# Patient Record
Sex: Male | Born: 2000 | Race: White | Hispanic: No | Marital: Single | Smoking: Never smoker
Health system: Southern US, Community
[De-identification: ages and names within clinical notes are randomized; demographics above are authoritative.]

## PROBLEM LIST (undated history)

## (undated) DIAGNOSIS — G43909 Migraine, unspecified, not intractable, without status migrainosus: Secondary | ICD-10-CM

## (undated) HISTORY — DX: Migraine, unspecified, not intractable, without status migrainosus: G43.909

## (undated) HISTORY — PX: WISDOM TOOTH EXTRACTION: SHX21

## (undated) HISTORY — PX: ULNAR COLLATERAL LIGAMENT RECONSTRUCTION: SHX2593

---
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
## 2018-08-14 DIAGNOSIS — M25529 Pain in unspecified elbow: Secondary | ICD-10-CM | POA: Insufficient documentation

## 2018-12-06 ENCOUNTER — Ambulatory Visit (INDEPENDENT_AMBULATORY_CARE_PROVIDER_SITE_OTHER): Payer: 59 | Admitting: Family Medicine

## 2018-12-06 DIAGNOSIS — H60502 Unspecified acute noninfective otitis externa, left ear: Secondary | ICD-10-CM

## 2018-12-06 MED ORDER — ACETIC ACID 2 % OT SOLN: 4.0000 [drp] | Freq: Four times a day (QID) | OTIC | 0 refills | Status: DC

## 2018-12-06 NOTE — Progress Notes (Signed)
   Chief Complaint:  Jacob Hebert is a 18 y.o. male who presents today for a virtual office visit with a chief complaint of ear drainage.   Assessment/Plan:  Otitis Externa Discussed limitations of virtual visit and inability to perform physical exam. History consistent with otitis externa, though cannot rule out ruptured otitis media (less likely due to normal hearing). Will start acetic acid drops. Will need in person office visit if not improving. Discussed reasons to return to care. Follow up as needed.     Subjective:  HPI:  Ear Pain  Started yesterday morning. Associated with drainage. Drainage is bloody. Hearing is normal. Nothing like this before. No obvious precipitating events. No recent swimming. No treatments tried. No other obvious aggravating or alleviating factors.   ROS: Per HPI  PMH: He reports that he has been smoking e-cigarettes. He has never used smokeless tobacco. He reports current alcohol use of about 7.0 standard drinks of alcohol per week. He reports that he does not use drugs.      Objective/Observations  Physical Exam: Gen: NAD, resting comfortably Pulm: Normal work of breathing Neuro: Grossly normal, moves all extremities Psych: Normal affect and thought content  Virtual Visit via Video   I connected with Jacob Hebert on 12/06/18 at 11:20 AM EST by a video enabled telemedicine application and verified that I am speaking with the correct person using two identifiers. I discussed the limitations of evaluation and management by telemedicine and the availability of in person appointments. The patient expressed understanding and agreed to proceed.   Patient location: Home Provider location: [place] participating in the virtual visit: Myself and Patient     Algis Greenhouse. Jerline Pain, MD 12/06/2018 11:25 AM

## 2018-12-19 ENCOUNTER — Ambulatory Visit (INDEPENDENT_AMBULATORY_CARE_PROVIDER_SITE_OTHER): Payer: 59 | Admitting: Family Medicine

## 2018-12-19 ENCOUNTER — Encounter: Payer: Self-pay | Admitting: Family Medicine

## 2018-12-19 ENCOUNTER — Other Ambulatory Visit: Payer: Self-pay

## 2018-12-19 ENCOUNTER — Other Ambulatory Visit (HOSPITAL_COMMUNITY)
Admission: RE | Admit: 2018-12-19 | Discharge: 2018-12-19 | Disposition: A | Discharge status: Home / Self Care | Payer: 59 | Source: Ambulatory Visit | Attending: Family Medicine | Admitting: Family Medicine

## 2018-12-19 VITALS — BP 110/70 | HR 70 | Temp 98.1°F | Ht 75.0 in | Wt 207.4 lb

## 2018-12-19 DIAGNOSIS — Z114 Encounter for screening for human immunodeficiency virus [HIV]: Secondary | ICD-10-CM

## 2018-12-19 DIAGNOSIS — Z113 Encounter for screening for infections with a predominantly sexual mode of transmission: Secondary | ICD-10-CM | POA: Insufficient documentation

## 2018-12-19 DIAGNOSIS — Z23 Encounter for immunization: Secondary | ICD-10-CM | POA: Diagnosis not present

## 2018-12-19 DIAGNOSIS — R1030 Lower abdominal pain, unspecified: Secondary | ICD-10-CM | POA: Diagnosis not present

## 2018-12-19 DIAGNOSIS — Z118 Encounter for screening for other infectious and parasitic diseases: Secondary | ICD-10-CM

## 2018-12-19 LAB — COMPREHENSIVE METABOLIC PANEL
ALT: 10 U/L (ref 0–53)
AST: 12 U/L (ref 0–37)
Albumin: 4.8 g/dL (ref 3.5–5.2)
Alkaline Phosphatase: 90 U/L (ref 39–117)
BUN: 14 mg/dL (ref 6–23)
CO2: 28 mEq/L (ref 19–32)
Calcium: 9.2 mg/dL (ref 8.4–10.5)
Chloride: 102 mEq/L (ref 96–112)
Creatinine, Ser: 0.94 mg/dL (ref 0.40–1.50)
GFR: 101.62 mL/min (ref >60.00)
Glucose, Bld: 87 mg/dL (ref 70–99)
Potassium: 4 mEq/L (ref 3.5–5.1)
Sodium: 139 mEq/L (ref 135–145)
Total Bilirubin: 0.9 mg/dL (ref 0.2–1.2)
Total Protein: 6.9 g/dL (ref 6.0–8.3)

## 2018-12-19 LAB — CBC WITH DIFFERENTIAL/PLATELET
Basophils Absolute: 0 10*3/uL (ref 0.0–0.1)
Basophils Relative: 0.3 % (ref 0.0–3.0)
Eosinophils Absolute: 0 10*3/uL (ref 0.0–0.7)
Eosinophils Relative: 1 % (ref 0.0–5.0)
HCT: 43.5 % (ref 39.0–52.0)
Hemoglobin: 15.6 g/dL (ref 13.0–17.0)
Lymphocytes Relative: 27.6 % (ref 12.0–46.0)
Lymphs Abs: 1.2 10*3/uL (ref 0.7–4.0)
MCHC: 35.9 g/dL (ref 30.0–36.0)
MCV: 82.9 fl (ref 78.0–100.0)
Monocytes Absolute: 0.4 10*3/uL (ref 0.1–1.0)
Monocytes Relative: 9.8 % (ref 3.0–12.0)
Neutro Abs: 2.6 10*3/uL (ref 1.4–7.7)
Neutrophils Relative %: 61.3 % (ref 43.0–77.0)
Platelets: 199 10*3/uL (ref 150.0–400.0)
RBC: 5.25 Mil/uL (ref 4.22–5.81)
RDW: 13.8 % (ref 11.5–14.6)
WBC: 4.3 10*3/uL — ABNORMAL LOW (ref 4.5–10.5)

## 2018-12-19 LAB — C-REACTIVE PROTEIN: CRP: <1 mg/dL (ref 0.5–20.0)

## 2018-12-19 LAB — SEDIMENTATION RATE: Sed Rate: 3 mm/hr (ref 0–15)

## 2018-12-19 NOTE — Patient Instructions (Addendum)
Health Maintenance Due  Topic Date Due  . HIV Screening - today along with other std tests 04/12/2013  . INFLUENZA VACCINE -today 09/02/2018   Please let us know if you have new or worsening symptoms  Lets start workup today with bloodwork, urine and we will contact you about ultrasound of abdomen.   If blood in stool- may get GI referral- I am also doing a test to look for inflammation in colon

## 2018-12-19 NOTE — Progress Notes (Signed)
Phone [phone] In person visit   Subjective:   Jacob Hebert is a 18 y.o. year old very pleasant male patient who presents for/with See problem oriented charting Chief Complaint  Patient presents with  . Follow-up  . stomach issues   ROS-no fever/chills.  Has lower abdominal pain.  Blood on toilet paper when wiping at least once a week.  Past Medical History-  Patient Active Problem List   Diagnosis Date Noted  . Insomnia 07/05/2017    Priority: Medium  . Asthma, mild intermittent     Priority: Medium  . Migraines     Priority: Medium  . Seasonal allergies 07/05/2017    Priority: Low  . Intermittent diarrhea 07/05/2017    Priority: Low  . Scapular dyskinesis 11/18/2016    Priority: Low    Medications- reviewed and updated Current Outpatient Medications  Medication Sig Dispense Refill  . acetic acid 2 % otic solution Place 4 drops into the left ear 4 (four) times daily. 15 mL 0  . albuterol (PROAIR HFA) 108 (90 Base) MCG/ACT inhaler Inhale 2 puffs into the lungs every 6 (six) hours as needed for wheezing or shortness of breath. 18 g 5  . levocetirizine (XYZAL) 5 MG tablet Take 5 mg by mouth every evening.     No current facility-administered medications for this visit.      Objective:  BP 110/70   Pulse 70   Temp 98.1 F (36.7 C)   Ht 6\' 3"  (1.905 m)   Wt 207 lb 6.4 oz (94.1 kg)   SpO2 98%   BMI 25.92 kg/m  Gen: NAD, resting comfortably CV: RRR no murmurs rubs or gallops Lungs: CTAB no crackles, wheeze, rhonchi Abdomen: soft/diffuse mild tenderness at bellybutton level and below/nondistended/normal bowel sounds.  Ext: no edema Skin: warm, dry Neuro: Normal gait and speech    Assessment and Plan   #tobacco- vaping some - encouraged cessation   #Social update-Tommy john surgery 11-12 weeks ago. Taking this semester off- starting back in the spring online at [employer].   Lower abdominal pain -  S:pt states when he wakes up his stomach "hurts" with pain  around the belly button and has to rush to the bathroom most morning. He states depending on what he eats decides how the next hours go, he has to eat a bland diet to keep his stomach from hurting. This has been going on since high school. Pt denies nausea vomiting and has diarrhea sometime in the mornings.He is having regular bowel movements. Abdominal cramping does get better with bowel movements but sometimes has recurrent bowel movement (has to go again quickly after going). Does not feel like symptoms are worse but having gone through shoulder surgery recently and not being in school right now and not being as active just appears morestable since he is less active.Ibuprofen some since surgery-not sure this helps much   After gets something basic in the morning/bland (like a plain biscuit but heavier things do not sit well- sometimes even skips breakfast)- he is able to tolerate other foods during the day. Has had less issues with [facility] or [place] food- used to cause diarrhea.   His main concern is being able to eat breakfast without pain.    He denies urinary symptoms like burning with peeing or frequent urination or penile discharge.  He has had blood in his stool at times- at least once a week but not down in toilet-mainly with wiping.  A/P: 18 year old male largely previously healthy (  other than migraines, insomnia, asthma) with history of GI issues including intermittent diarrhea throughout high school now presenting with pain in his lower abdomen for at least the last year in the morning which improves as the day goes on without intervention but is worsened by heavy meals. -We will get basic labs including CBC, CMP, UA.  Given reported blood in the stool will get fecal occult blood test when not actively having bleeding.  Consider GI referral.  Sedimentation rate and CRP were not elevated pointing away from ulcerative colitis.  We will also get an abdominal ultrasound-we will target right upper  quadrant to rule out gallbladder disease though I do not suspect that is the cause of his symptoms.  #No penile discharge but has had unprotected sex without prior STD follow-up testing-he agrees to these being done today  Recommended follow up: As needed for new or worsening symptoms or failure to improve  Lab/Order associations:   ICD-10-CM   1. Lower abdominal pain  R10.30 CBC with Differential    Comprehensive metabolic panel    POC UA    US Abdomen Complete    Sedimentation rate    C-reactive protein    Fecal occult blood, imunochemical    Fecal occult blood, imunochemical    POC UA    CANCELED: Stool cards as of 07/01/17  2. Need for immunization against influenza  Z23 Flu Vaccine QUAD 36+ mos IM  3. Screening for HIV (human immunodeficiency virus)  Z11.4 HIV antibody  4. Screening examination for venereal disease  Z11.3 RPR  5. Screening for gonorrhea  Z11.3 Urine cytology ancillary only    Urine cytology ancillary only  6. Screening for chlamydial disease  Z11.8 Urine cytology ancillary only    Urine cytology ancillary only    Time Stamp The duration of face-to-face time during this visit was greater than 25 minutes. Greater than 50% of this time was spent in counseling, explanation of diagnosis, planning of further management, and/or coordination of care including discussion of potential causes, discussion of needed work-up, counseling on need for STD screening as well as protection use, counseling about influenza vaccination.    Return precautions advised.  Garret Reddish, MD

## 2018-12-20 LAB — POCT URINALYSIS DIP (MANUAL ENTRY)
Bilirubin, UA: NEGATIVE
Blood, UA: NEGATIVE
Glucose, UA: NEGATIVE mg/dL
Ketones, POC UA: NEGATIVE mg/dL
Leukocytes, UA: NEGATIVE
Nitrite, UA: NEGATIVE
Protein Ur, POC: NEGATIVE mg/dL
Spec Grav, UA: >=1.03 — AB (ref 1.010–1.025)
Urobilinogen, UA: 0.2 E.U./dL
pH, UA: 5.5 (ref 5.0–8.0)

## 2018-12-20 LAB — URINE CYTOLOGY ANCILLARY ONLY
Chlamydia: NEGATIVE
Comment: NEGATIVE
Comment: NEGATIVE
Comment: NORMAL
Neisseria Gonorrhea: NEGATIVE
Trichomonas: NEGATIVE

## 2018-12-20 LAB — HIV ANTIBODY (ROUTINE TESTING W REFLEX): HIV 1&2 Ab, 4th Generation: NONREACTIVE

## 2018-12-20 LAB — RPR: RPR Ser Ql: NONREACTIVE

## 2018-12-20 NOTE — Addendum Note (Signed)
Addended by: Francis Dowse T on: 12/20/2018 09:04 AM   Modules accepted: Orders

## 2018-12-27 ENCOUNTER — Other Ambulatory Visit (INDEPENDENT_AMBULATORY_CARE_PROVIDER_SITE_OTHER): Payer: 59

## 2018-12-27 ENCOUNTER — Other Ambulatory Visit: Payer: Self-pay

## 2018-12-27 DIAGNOSIS — R197 Diarrhea, unspecified: Secondary | ICD-10-CM

## 2018-12-27 LAB — FECAL OCCULT BLOOD, IMMUNOCHEMICAL: Fecal Occult Bld: NEGATIVE

## 2019-06-26 ENCOUNTER — Other Ambulatory Visit: Payer: Self-pay | Admitting: Family Medicine

## 2019-06-28 ENCOUNTER — Telehealth: Payer: Self-pay | Admitting: Family Medicine

## 2019-06-28 NOTE — Telephone Encounter (Signed)
Patient refused to go to the ED  Chief Complaint Headache Reason for Call Symptomatic / Request for Health Information Initial Comment Caller states her son is having upper respiratory pain . He has a cough, congestion, headache, and sore throat. He had a negative covid test today. Translation No  Confirm and document reason for call. If symptomatic, describe symptoms. ---Pt has cough, HA congestion and sorethroat (light cough congestion onset Sun am then the rest came on recently). 99.7 temp on temple. [facility] Has the patient had close contact with a person known or suspected to have the novel coronavirus illness OR traveled / lives in area with major community spread (including international travel) in the last 14 days from the onset of symptoms? * If Asymptomatic, screen for exposure and travel within the last 14 days. ---Yes Does the patient have any new or worsening symptoms? ---Yes Will a triage be completed? ---Yes Related visit to physician within the last 2 weeks? ---No Does the PT have any chronic conditions? (i.e. diabetes, asthma, this includes High risk factors for pregnancy, etc.) ---Yes List chronic conditions. ---asthma Is this a behavioral health or substance abuse call? ---No Guidelines Guideline Title Affirmed Question Affirmed Notes Nurse Date/Time (Eastern Time) Asthma Attack Chest pain Ladona Ridgel, RN, Sunny Schlein 06/27/2019 4:57:28 PM COVID-19 - Diagnosed or Suspected Chest pain or pressure Gaddy, RN, Felicia 06/27/2019 5:01:00 PMPLEASE NOTE: All timestamps contained within this report are represented as [place] Standard Time. CONFIDENTIALTY NOTICE: This fax transmission is intended only for the addressee. It contains information that is legally privileged, confidential or otherwise protected from use or disclosure. If you are not the intended recipient, you are strictly prohibited from reviewing, disclosing, copying using or disseminating  any of this information or taking any action in reliance on or regarding this information. If you have received this fax in error, please notify us immediately by telephone so that we can arrange for its return to [place]. Phone: [phone], Toll-Free: [phone], Fax: [phone] Page: 2 of 2 Call Id: [id number] Disp. Time Lamount Cohen Time) Disposition Final User 06/27/2019 5:00:09 PM Go to ED Now Ladona Ridgel, RN, Sunny Schlein 06/27/2019 5:04:10 PM Go to ED Now Yes Ladona Ridgel, RN, Sunny Schlein Disposition Overriden: Go to ED Now (or PCP triage) Override Reason: Specify reason. (Please document in 'advice recommended' section) Caller Disagree/Comply Disagree Caller Understands Yes PreDisposition Did not know what to do Care Advice Given Per Guideline GO TO ED NOW: GO TO ED NOW (OR PCP TRIAGE): GO TO ED NOW: * Tell the first healthcare worker you meet that you may have COVID-19. Comments User: Hampton Abbot, RN Date/Time Lamount Cohen Time): 06/27/2019 4:57:06 PM no specific exposure - but has been around people User: Hampton Abbot, RN Date/Time (Eastern Time): 06/27/2019 4:58:12 PM HA is level 3-4, sore throat is mild User: Hampton Abbot, RN Date/Time (Eastern Time): 06/27/2019 5:08:37 PM office is closed - they dont want to go anywhere tonight and office is closed so cant transfer them for disagreeing - Referrals GO TO FACILITY UNDECIDED GO TO FACILITY REFUSE

## 2019-06-28 NOTE — Telephone Encounter (Signed)
Called and lm for pt tcb. 

## 2019-10-16 ENCOUNTER — Emergency Department (HOSPITAL_BASED_OUTPATIENT_CLINIC_OR_DEPARTMENT_OTHER): Payer: 59

## 2019-10-16 ENCOUNTER — Other Ambulatory Visit: Payer: Self-pay

## 2019-10-16 ENCOUNTER — Emergency Department (HOSPITAL_BASED_OUTPATIENT_CLINIC_OR_DEPARTMENT_OTHER)
Admission: EM | Admit: 2019-10-16 | Discharge: 2019-10-16 | Disposition: A | Discharge status: Home / Self Care | Payer: 59 | Attending: Emergency Medicine | Admitting: Emergency Medicine

## 2019-10-16 ENCOUNTER — Encounter (HOSPITAL_BASED_OUTPATIENT_CLINIC_OR_DEPARTMENT_OTHER): Payer: Self-pay | Admitting: *Deleted

## 2019-10-16 DIAGNOSIS — Y9364 Activity, baseball: Secondary | ICD-10-CM | POA: Insufficient documentation

## 2019-10-16 DIAGNOSIS — W2103XA Struck by baseball, initial encounter: Secondary | ICD-10-CM | POA: Insufficient documentation

## 2019-10-16 DIAGNOSIS — S022XXA Fracture of nasal bones, initial encounter for closed fracture: Secondary | ICD-10-CM | POA: Insufficient documentation

## 2019-10-16 DIAGNOSIS — Z79899 Other long term (current) drug therapy: Secondary | ICD-10-CM | POA: Insufficient documentation

## 2019-10-16 DIAGNOSIS — J4520 Mild intermittent asthma, uncomplicated: Secondary | ICD-10-CM | POA: Insufficient documentation

## 2019-10-16 DIAGNOSIS — F17290 Nicotine dependence, other tobacco product, uncomplicated: Secondary | ICD-10-CM | POA: Insufficient documentation

## 2019-10-16 DIAGNOSIS — Y998 Other external cause status: Secondary | ICD-10-CM | POA: Diagnosis not present

## 2019-10-16 DIAGNOSIS — Y999 Unspecified external cause status: Secondary | ICD-10-CM | POA: Insufficient documentation

## 2019-10-16 DIAGNOSIS — S0992XA Unspecified injury of nose, initial encounter: Secondary | ICD-10-CM | POA: Diagnosis present

## 2019-10-16 MED ORDER — HYDROCODONE-ACETAMINOPHEN 5-325 MG PO TABS: 1.0000 | ORAL_TABLET | ORAL | 0 refills | Status: DC | PRN

## 2019-10-16 MED ORDER — OXYMETAZOLINE HCL 0.05 % NA SOLN
1.0000 | Freq: Once | NASAL | Status: AC
  Administered 2019-10-16: 1 via NASAL
  Filled 2019-10-16: qty 30

## 2019-10-16 NOTE — Discharge Instructions (Addendum)
Use Afrin as needed. Do not blow your nose. Take Motrin/Tylenol as needed as directed. Take Norco in place of your next Tylenol dose for pain not controlled with Tylenol. Apply ice for 20 minutes at a time. Follow up with your ENT. Return to ER for worsening or concerning symptoms.

## 2019-10-16 NOTE — ED Provider Notes (Signed)
[facility] EMERGENCY DEPARTMENT Provider Note   CSN: [id number] Arrival date & time: 10/16/19  1501     History Chief Complaint  Patient presents with  . Facial Injury    Jacob Hebert is a 19 y.o. male.  Jacob Hebert is a 19 year old male with PMH significant for asthma and migraines who presents with a chief complaint of being hit in the face with a baseball today. Patient's mother is present and assists in providing the history. Patient was playing baseball this afternoon when he was struck by a baseball on the left side of his face. He states that he heard some ringing in his ears and fell to the ground and hit his head. He denies LOC, headache, nausea, or vomiting. After the initial shock of the impact patient was able to stand up and the ringing resolved and his nose started bleeding. Prior to arrival to the ED, the epistaxis had resolved and his pain was worst in his teeth/gums. While waiting in the ED the epistaxis resumed but the patient reported blowing his nose frequently since the accident. He is now expereincing pain around his nose and left maxillary area, denies loose teeth or malalignment. He has no current epistaxis and has not taken anything for pain. Patient now reports that he feels full/congested in his nasal passages and ears.        Past Medical History:  Diagnosis Date  . Asthma    sparing albuterol- mainly with sickness. has not had issues with sports in years.   . Migraines    rx through Dr. Chestine Spore. about once a month    Patient Active Problem List   Diagnosis Date Noted  . Insomnia 07/05/2017  . Seasonal allergies 07/05/2017  . Intermittent diarrhea 07/05/2017  . Asthma, mild intermittent   . Migraines   . Scapular dyskinesis 11/18/2016    Past Surgical History:  Procedure Laterality Date  . ULNAR COLLATERAL LIGAMENT RECONSTRUCTION     Tommy John surgery  . WISDOM TOOTH EXTRACTION         Family History  Problem Relation Age  of Onset  . Arthritis Mother   . Atrial fibrillation Mother   . Arthritis Father   . Hyperlipidemia Father   . GER disease Father   . Colon polyps Father        age  6 it appears  . Asthma Brother        age 40 in 2019  . Arthritis Maternal Grandmother   . Diabetes Maternal Grandmother   . Dementia Maternal Grandmother   . Hearing loss Maternal Grandfather     Social History   Tobacco Use  . Smoking status: Current Some Day Smoker    Types: E-cigarettes  . Smokeless tobacco: Never Used  . Tobacco comment: vapes some- advised against  Vaping Use  . Vaping Use: Every day  Substance Use Topics  . Alcohol use: Yes    Alcohol/week: 7.0 standard drinks    Types: 7 Standard drinks or equivalent per week    Comment: never drinks and drives  . Drug use: Never    Home Medications Prior to Admission medications   Medication Sig Start Date End Date Taking? Authorizing Provider  acetic acid 2 % otic solution Place 4 drops into the left ear 4 (four) times daily. 12/06/18   Ardith Dark, MD  HYDROcodone-acetaminophen (NORCO/VICODIN) 5-325 MG tablet Take 1 tablet by mouth every 4 (four) hours as needed. 10/16/19   Eulah Pont,  Gerome Apley, PA-C  levocetirizine (XYZAL) 5 MG tablet Take 5 mg by mouth every evening.    [provider]  VENTOLIN HFA 108 (90 Base) MCG/ACT inhaler INHALE 2 PUFFS INTO THE LUNGS EVERY 6 HOURS AS NEEDED FOR WHEEZING OR SHORTNESS OF BREATH 06/26/19   Shelva Majestic, MD    Allergies    Patient has no known allergies.  Review of Systems   Review of Systems  Constitutional: Negative for fever.  HENT: Positive for congestion, facial swelling and nosebleeds. Negative for dental problem, ear discharge and ear pain.   Eyes: Negative for pain and visual disturbance.  Musculoskeletal: Negative for neck pain and neck stiffness.  Skin: Negative for rash and wound.  Allergic/Immunologic: Negative for immunocompromised state.  Neurological: Negative for dizziness  and headaches.  All other systems reviewed and are negative.   Physical Exam Updated Vital Signs BP (!) 124/45 (BP Location: Right Arm)   Pulse (!) 46   Temp 98.2 F (36.8 C) (Oral)   Resp 16   Ht 6\' 3"  (1.905 m)   Wt 88.5 kg   SpO2 98%   BMI 24.37 kg/m   Physical Exam Vitals and nursing note reviewed.  Constitutional:      General: He is not in acute distress.    Appearance: He is well-developed. He is not diaphoretic.  HENT:     Head: Normocephalic and atraumatic.     Right Ear: Tympanic membrane and ear canal normal.     Left Ear: Tympanic membrane and ear canal normal.     Nose: Septal deviation, signs of injury and nasal tenderness present.     Right Nostril: Epistaxis present. No septal hematoma.     Left Nostril: No septal hematoma.      Comments: No orbital tenderness, EOMI. No active bleeding, blood noted in both nostrils, more so on the right.  Pulmonary:     Effort: Pulmonary effort is normal.  Skin:    General: Skin is warm and dry.     Findings: Bruising present. No erythema or rash.  Neurological:     Mental Status: He is alert and oriented to person, place, and time.  Psychiatric:        Behavior: Behavior normal.     ED Results / Procedures / Treatments   Labs (all labs ordered are listed, but only abnormal results are displayed) Labs Reviewed - No data to display  EKG None  Radiology DG Nasal Bones  Result Date: 10/16/2019 CLINICAL DATA:  Hit by baseball EXAM: NASAL BONES - 3+ VIEW COMPARISON:  None. FINDINGS: There are apparent fractures of the left and right nasal bones as well as a fracture of the anterior nasal spine. Alignment in these areas near anatomic. No dislocation. There is rightward deviation of the lower nasal septum. Visualized paranasal sinuses clear. IMPRESSION: Fractures of the left and right nasal bones as well as the anterior nasal spine with alignment in these areas near anatomic. No dislocation. Visualized paranasal sinuses  clear. Electronically Signed   By: 10/18/2019 III M.D.   On: 10/16/2019 16:17    Procedures Procedures (including critical care time)  Medications Ordered in ED Medications  oxymetazoline (AFRIN) 0.05 % nasal spray 1 spray (1 spray Each Nare Given 10/16/19 1521)    ED Course  I have reviewed the triage vital signs and the nursing notes.  Pertinent labs & imaging results that were available during my care of the patient were reviewed by me and considered  in my medical decision making (see chart for details).  Clinical Course as of Oct 16 2002  Tue Oct 16, 2019  [id number] 19 year old male with nose injury from baseball to the face today. No loose teeth noted however with report of soreness, recommend soft diet and see dentist. XR + for fractures, no active bleeding at time of exam. Advised to use afrin as needed, do NOT blow nose, apply ice for 20 minutes at a time and follow up with ENT. Given rx for Norco for pain not controlled with OTC meds.    [LM]    Clinical Course User Index [LM] Alden Hipp   MDM Rules/Calculators/A&P                         Final Clinical Impression(s) / ED Diagnoses Final diagnoses:  Closed fracture of nasal bone, initial encounter    Rx / DC Orders ED Discharge Orders         Ordered    HYDROcodone-acetaminophen (NORCO/VICODIN) 5-325 MG tablet  Every 4 hours PRN        10/16/19 1938           Jeannie Fend, PA-C 10/16/19 2004    Arby Barrette, MD 10/17/19 2104

## 2019-10-16 NOTE — ED Triage Notes (Signed)
Nose injury by baseball x 30 mins ago

## 2019-10-23 DIAGNOSIS — S022XXA Fracture of nasal bones, initial encounter for closed fracture: Secondary | ICD-10-CM | POA: Insufficient documentation

## 2019-10-24 ENCOUNTER — Other Ambulatory Visit: Payer: Self-pay

## 2019-10-24 ENCOUNTER — Encounter (HOSPITAL_BASED_OUTPATIENT_CLINIC_OR_DEPARTMENT_OTHER): Payer: Self-pay | Admitting: Otolaryngology

## 2019-10-24 ENCOUNTER — Other Ambulatory Visit (HOSPITAL_COMMUNITY)
Admission: RE | Admit: 2019-10-24 | Discharge: 2019-10-24 | Disposition: A | Discharge status: Home / Self Care | Payer: 59 | Source: Ambulatory Visit | Attending: Otolaryngology | Admitting: Otolaryngology

## 2019-10-24 DIAGNOSIS — Z01812 Encounter for preprocedural laboratory examination: Secondary | ICD-10-CM | POA: Diagnosis not present

## 2019-10-24 DIAGNOSIS — Z20822 Contact with and (suspected) exposure to covid-19: Secondary | ICD-10-CM | POA: Diagnosis not present

## 2019-10-24 LAB — SARS CORONAVIRUS 2 (TAT 6-24 HRS): SARS Coronavirus 2: NEGATIVE

## 2019-10-24 NOTE — H&P (Signed)
HPI:   Jacob Hebert is a 19 y.o. male who presents as a consult Patient.   Referring Provider: Domenic Schwab I*  Chief complaint: Nasal fracture.  HPI: 1 week ago today he was hit in the nose by a baseball at practice. He had bruising swelling and bleeding. He is having difficulty breathing through the left. He feels that there is a cosmetic deformity of the left side. No prior history of nasal fracture.  PMH/Meds/All/SocHx/FamHx/ROS:   Past Medical History:  Diagnosis Date  . Asthma   Past Surgical History:  Procedure Laterality Date  . ELBOW SURGERY   No family history of bleeding disorders, wound healing problems or difficulty with anesthesia.   Social History   Socioeconomic History  . Marital status: Single  Spouse name: Not on file  . Number of children: Not on file  . Years of education: Not on file  . Highest education level: Not on file  Occupational History  . Not on file  Tobacco Use  . Smoking status: Current Every Day Smoker  Types: E-Cig/Vapor w/Nicotine  . Smokeless tobacco: Never Used  Vaping Use  . Vaping Use: Every day  Substance and Sexual Activity  . Alcohol use: Not on file  . Drug use: Never  . Sexual activity: Not on file  Other Topics Concern  . Not on file  Social History Narrative  . Not on file   Social Determinants of Health   Financial Resource Strain:  . Difficulty of Paying Living Expenses: Not on file  Food Insecurity:  . Worried About Programme researcher, broadcasting/film/video in the Last Year: Not on file  . Ran Out of Food in the Last Year: Not on file  Transportation Needs:  . Lack of Transportation (Medical): Not on file  . Lack of Transportation (Non-Medical): Not on file  Physical Activity:  . Days of Exercise per Week: Not on file  . Minutes of Exercise per Session: Not on file  Stress:  . Feeling of Stress : Not on file  Social Connections:  . Frequency of Communication with Friends and Family: Not on file  . Frequency of  Social Gatherings with Friends and Family: Not on file  . Attends Religious Services: Not on file  . Active Member of Clubs or Organizations: Not on file  . Attends Banker Meetings: Not on file  . Marital Status: Not on file   Current Outpatient Medications:  . albuterol 90 mcg/actuation inhaler, albuterol sulfate HFA 90 mcg/actuation aerosol inhaler, Disp: , Rfl:  . HYDROcodone-acetaminophen (NORCO) 5-325 mg per tablet, TAKE 1 TABLET BY MOUTH EVERY 4 (FOUR) HOURS AS NEEDED., Disp: , Rfl:   A complete ROS was performed with pertinent positives/negatives noted in the HPI. The remainder of the ROS are negative.   Physical Exam:   BP 112/66  Pulse 51  Temp 97.7 F (36.5 C)  Ht 1.905 m (6\' 3" )  Wt 90.3 kg (199 lb)  BMI 24.87 kg/m   General: Healthy and alert, in no distress, breathing easily. Normal affect. In a pleasant mood. Head: Normocephalic, atraumatic. No masses, or scars. Eyes: Bilateral eyelid ecchymosis. Pupils are equal, and reactive to light. Vision is grossly intact. No spontaneous or gaze nystagmus. Ears: Ear canals are clear. Tympanic membranes are intact, with normal landmarks and the middle ears are clear and healthy. Hearing: Grossly normal. Nose: There is palpable depression of the left nasal bone. There is some residual bruising and swelling. There is leftward septal deviation  with significant nasal obstruction on the left. There is no evidence of septal hematoma. Face: No masses or scars, facial nerve function is symmetric. Oral Cavity: No mucosal abnormalities are noted. Tongue with normal mobility. Dentition appears healthy. Oropharynx: Tonsils are symmetric. There are no mucosal masses identified. Tongue base appears normal and healthy. Larynx/Hypopharynx: deferred Chest: Deferred Neck: No palpable masses, no cervical adenopathy, no thyroid nodules or enlargement. Neuro: Cranial nerves II-XII with normal function. Balance: Normal gate. Other  findings: none.  Independent Review of Additional Tests or Records:  none  Procedures:  none  Impression & Plans:  Nasal fracture with displacement. Recommend closed reduction under anesthesia. This will need to be performed in the next week or 2. He would like to get this done as soon as possible. He will need a face protector for returning to baseball after the procedure.

## 2019-10-26 ENCOUNTER — Ambulatory Visit (HOSPITAL_BASED_OUTPATIENT_CLINIC_OR_DEPARTMENT_OTHER)
Admission: RE | Admit: 2019-10-26 | Discharge: 2019-10-26 | Disposition: A | Discharge status: Home / Self Care | Payer: 59 | Attending: Otolaryngology | Admitting: Otolaryngology

## 2019-10-26 ENCOUNTER — Encounter (HOSPITAL_BASED_OUTPATIENT_CLINIC_OR_DEPARTMENT_OTHER): Payer: Self-pay | Admitting: Otolaryngology

## 2019-10-26 ENCOUNTER — Ambulatory Visit (HOSPITAL_BASED_OUTPATIENT_CLINIC_OR_DEPARTMENT_OTHER): Payer: 59 | Admitting: Certified Registered"

## 2019-10-26 ENCOUNTER — Other Ambulatory Visit: Payer: Self-pay

## 2019-10-26 ENCOUNTER — Encounter (HOSPITAL_BASED_OUTPATIENT_CLINIC_OR_DEPARTMENT_OTHER)
Admission: RE | Disposition: A | Discharge status: Home / Self Care | Payer: Self-pay | Source: Home / Self Care | Attending: Otolaryngology

## 2019-10-26 DIAGNOSIS — Z79899 Other long term (current) drug therapy: Secondary | ICD-10-CM | POA: Diagnosis not present

## 2019-10-26 DIAGNOSIS — W2103XA Struck by baseball, initial encounter: Secondary | ICD-10-CM | POA: Diagnosis not present

## 2019-10-26 DIAGNOSIS — J45909 Unspecified asthma, uncomplicated: Secondary | ICD-10-CM | POA: Insufficient documentation

## 2019-10-26 DIAGNOSIS — F17290 Nicotine dependence, other tobacco product, uncomplicated: Secondary | ICD-10-CM | POA: Diagnosis not present

## 2019-10-26 DIAGNOSIS — S022XXA Fracture of nasal bones, initial encounter for closed fracture: Secondary | ICD-10-CM | POA: Insufficient documentation

## 2019-10-26 HISTORY — PX: CLOSED REDUCTION NASAL FRACTURE: SHX5365

## 2019-10-26 SURGERY — CLOSED REDUCTION, FRACTURE, NASAL BONE
Anesthesia: General | Site: Nose

## 2019-10-26 MED ORDER — BACITRACIN ZINC 500 UNIT/GM EX OINT
TOPICAL_OINTMENT | CUTANEOUS | Status: AC
  Filled 2019-10-26: qty 28.35

## 2019-10-26 MED ORDER — OXYMETAZOLINE HCL 0.05 % NA SOLN
2.0000 | NASAL | Status: DC
  Administered 2019-10-26: 2 via NASAL

## 2019-10-26 MED ORDER — LACTATED RINGERS IV SOLN: INTRAVENOUS | Status: DC

## 2019-10-26 MED ORDER — LIDOCAINE 2% (20 MG/ML) 5 ML SYRINGE
INTRAMUSCULAR | Status: AC
  Filled 2019-10-26: qty 5

## 2019-10-26 MED ORDER — ONDANSETRON HCL 4 MG/2ML IJ SOLN
INTRAMUSCULAR | Status: AC
  Filled 2019-10-26: qty 2

## 2019-10-26 MED ORDER — LIDOCAINE-EPINEPHRINE 1 %-1:100000 IJ SOLN
INTRAMUSCULAR | Status: AC
  Filled 2019-10-26: qty 1

## 2019-10-26 MED ORDER — MEPERIDINE HCL 25 MG/ML IJ SOLN: 6.2500 mg | INTRAMUSCULAR | Status: DC | PRN

## 2019-10-26 MED ORDER — FENTANYL CITRATE (PF) 100 MCG/2ML IJ SOLN
INTRAMUSCULAR | Status: DC | PRN
Start: 2019-10-26 — End: 2019-10-26
  Administered 2019-10-26 (×2): 50 ug via INTRAVENOUS

## 2019-10-26 MED ORDER — DEXAMETHASONE SODIUM PHOSPHATE 10 MG/ML IJ SOLN
INTRAMUSCULAR | Status: DC | PRN
  Administered 2019-10-26: 10 mg via INTRAVENOUS

## 2019-10-26 MED ORDER — OXYMETAZOLINE HCL 0.05 % NA SOLN
NASAL | Status: AC
  Filled 2019-10-26: qty 30

## 2019-10-26 MED ORDER — FENTANYL CITRATE (PF) 100 MCG/2ML IJ SOLN
INTRAMUSCULAR | Status: AC
  Filled 2019-10-26: qty 2

## 2019-10-26 MED ORDER — OXYMETAZOLINE HCL 0.05 % NA SOLN
NASAL | Status: DC | PRN
  Administered 2019-10-26: 1

## 2019-10-26 MED ORDER — MIDAZOLAM HCL 5 MG/5ML IJ SOLN
INTRAMUSCULAR | Status: DC | PRN
  Administered 2019-10-26: 2 mg via INTRAVENOUS

## 2019-10-26 MED ORDER — ONDANSETRON HCL 4 MG/2ML IJ SOLN: 4.0000 mg | Freq: Once | INTRAMUSCULAR | Status: DC | PRN

## 2019-10-26 MED ORDER — PROPOFOL 10 MG/ML IV BOLUS
INTRAVENOUS | Status: AC
  Filled 2019-10-26: qty 40

## 2019-10-26 MED ORDER — PROPOFOL 10 MG/ML IV BOLUS
INTRAVENOUS | Status: DC | PRN
  Administered 2019-10-26: 200 mg via INTRAVENOUS
  Administered 2019-10-26: 100 mg via INTRAVENOUS

## 2019-10-26 MED ORDER — ONDANSETRON HCL 4 MG/2ML IJ SOLN
INTRAMUSCULAR | Status: DC | PRN
  Administered 2019-10-26: 4 mg via INTRAVENOUS

## 2019-10-26 MED ORDER — DEXAMETHASONE SODIUM PHOSPHATE 10 MG/ML IJ SOLN
INTRAMUSCULAR | Status: AC
  Filled 2019-10-26: qty 1

## 2019-10-26 MED ORDER — HYDROMORPHONE HCL 1 MG/ML IJ SOLN: 0.2500 mg | INTRAMUSCULAR | Status: DC | PRN

## 2019-10-26 MED ORDER — PROPOFOL 10 MG/ML IV BOLUS
INTRAVENOUS | Status: AC
  Filled 2019-10-26: qty 20

## 2019-10-26 MED ORDER — MIDAZOLAM HCL 2 MG/2ML IJ SOLN
INTRAMUSCULAR | Status: AC
  Filled 2019-10-26: qty 2

## 2019-10-26 MED ORDER — LIDOCAINE-EPINEPHRINE 1 %-1:100000 IJ SOLN
INTRAMUSCULAR | Status: DC | PRN
  Administered 2019-10-26: 1 mL

## 2019-10-26 MED ORDER — LIDOCAINE HCL (CARDIAC) PF 100 MG/5ML IV SOSY
PREFILLED_SYRINGE | INTRAVENOUS | Status: DC | PRN
  Administered 2019-10-26: 100 mg via INTRAVENOUS

## 2019-10-26 SURGICAL SUPPLY — 37 items
APL SKNCLS STERI-STRIP NONHPOA (GAUZE/BANDAGES/DRESSINGS) ×1
BENZOIN TINCTURE PRP APPL 2/3 (GAUZE/BANDAGES/DRESSINGS) ×3 IMPLANT
CANISTER SUCT 1200ML W/VALVE (MISCELLANEOUS) ×3 IMPLANT
CLOSURE WOUND 1/4X4 (GAUZE/BANDAGES/DRESSINGS) ×1
CNTNR URN SCR LID CUP LEK RST (MISCELLANEOUS) IMPLANT
CONT SPEC 4OZ STRL OR WHT (MISCELLANEOUS)
COVER BACK TABLE 60X90IN (DRAPES) ×3 IMPLANT
DECANTER SPIKE VIAL GLASS SM (MISCELLANEOUS) ×2 IMPLANT
DRSG CURAD 3X16 NADH (PACKING) IMPLANT
DRSG NASAL KENNEDY LMNT 8CM (GAUZE/BANDAGES/DRESSINGS) IMPLANT
DRSG NASOPORE 8CM (GAUZE/BANDAGES/DRESSINGS) IMPLANT
DRSG TELFA 3X8 NADH (GAUZE/BANDAGES/DRESSINGS) IMPLANT
GAUZE PACKING IODOFORM 1/2 (PACKING) IMPLANT
GAUZE SPONGE 4X4 12PLY STRL LF (GAUZE/BANDAGES/DRESSINGS) ×3 IMPLANT
GLOVE BIO SURGEON STRL SZ 6.5 (GLOVE) ×1 IMPLANT
GLOVE BIO SURGEONS STRL SZ 6.5 (GLOVE) ×1
GLOVE ECLIPSE 7.5 STRL STRAW (GLOVE) ×3 IMPLANT
GOWN STRL REUS W/ TWL LRG LVL3 (GOWN DISPOSABLE) ×1 IMPLANT
GOWN STRL REUS W/TWL LRG LVL3 (GOWN DISPOSABLE) ×3
MARKER SKIN DUAL TIP RULER LAB (MISCELLANEOUS) IMPLANT
NDL PRECISIONGLIDE 27X1.5 (NEEDLE) ×1 IMPLANT
NEEDLE PRECISIONGLIDE 27X1.5 (NEEDLE) ×3 IMPLANT
PAD DRESSING TELFA 3X8 NADH (GAUZE/BANDAGES/DRESSINGS) IMPLANT
PATTIES SURGICAL .5 X3 (DISPOSABLE) ×3 IMPLANT
SHEET MEDIUM DRAPE 40X70 STRL (DRAPES) ×3 IMPLANT
SHEET SILICONE 2X3 0.03 REINF (MISCELLANEOUS) IMPLANT
SPLINT NASAL THERMO PLAST (MISCELLANEOUS) ×3 IMPLANT
SPONGE GAUZE 2X2 8PLY STER LF (GAUZE/BANDAGES/DRESSINGS) ×1
SPONGE GAUZE 2X2 8PLY STRL LF (GAUZE/BANDAGES/DRESSINGS) ×2 IMPLANT
SPONGE SURGIFOAM ABS GEL 12-7 (HEMOSTASIS) IMPLANT
STRIP CLOSURE SKIN 1/4X4 (GAUZE/BANDAGES/DRESSINGS) ×2 IMPLANT
SUT ETHILON 3 0 PS 1 (SUTURE) IMPLANT
SYR CONTROL 10ML LL (SYRINGE) ×3 IMPLANT
TOWEL GREEN STERILE FF (TOWEL DISPOSABLE) ×3 IMPLANT
TUBE CONNECTING 20'X1/4 (TUBING) ×1
TUBE CONNECTING 20X1/4 (TUBING) ×2 IMPLANT
YANKAUER SUCT BULB TIP NO VENT (SUCTIONS) IMPLANT

## 2019-10-26 NOTE — Anesthesia Postprocedure Evaluation (Signed)
Anesthesia Post Note  Patient: Jacob Hebert  Procedure(s) Performed: CLOSED REDUCTION NASAL FRACTURE (N/A Nose)     Patient location during evaluation: PACU Anesthesia Type: General Level of consciousness: awake and alert Pain management: pain level controlled Vital Signs Assessment: post-procedure vital signs reviewed and stable Respiratory status: spontaneous breathing, nonlabored ventilation, respiratory function stable and patient connected to nasal cannula oxygen Cardiovascular status: blood pressure returned to baseline and stable Postop Assessment: no apparent nausea or vomiting Anesthetic complications: no   No complications documented.  Last Vitals:  Vitals:   10/26/19 1005 10/26/19 1025  BP: 109/72 (!) 113/57  Pulse: 64 (!) 59  Resp: 16 14  Temp: 36.9 C 36.7 C  SpO2: 98% 100%    Last Pain:  Vitals:   10/26/19 1025  TempSrc:   PainSc: 0-No pain                 Fouad Taul DAVID

## 2019-10-26 NOTE — Anesthesia Procedure Notes (Signed)
Procedure Name: LMA Insertion Date/Time: 10/26/2019 9:11 AM Performed by: Lauralyn Primes, CRNA Pre-anesthesia Checklist: Patient identified, Emergency Drugs available, Suction available and Patient being monitored Patient Re-evaluated:Patient Re-evaluated prior to induction Oxygen Delivery Method: Circle system utilized Preoxygenation: Pre-oxygenation with 100% oxygen Induction Type: IV induction Ventilation: Mask ventilation without difficulty LMA: LMA inserted LMA Size: 5.0 Number of attempts: 1 Airway Equipment and Method: Bite block Placement Confirmation: positive ETCO2 Tube secured with: Tape Dental Injury: Teeth and Oropharynx as per pre-operative assessment

## 2019-10-26 NOTE — Transfer of Care (Signed)
Immediate Anesthesia Transfer of Care Note  Patient: Jacob Hebert  Procedure(s) Performed: CLOSED REDUCTION NASAL FRACTURE (N/A Nose)  Patient Location: PACU  Anesthesia Type:General  Level of Consciousness: awake, alert  and oriented  Airway & Oxygen Therapy: Patient Spontanous Breathing and Patient connected to face mask oxygen  Post-op Assessment: Report given to RN and Post -op Vital signs reviewed and stable  Post vital signs: Reviewed and stable  Last Vitals:  Vitals Value Taken Time  BP 126/68 10/26/19 0941  Temp    Pulse 65 10/26/19 0944  Resp 18 10/26/19 0944  SpO2 100 % 10/26/19 0944  Vitals shown include unvalidated device data.  Last Pain:  Vitals:   10/26/19 0802  TempSrc: Oral  PainSc: 2          Complications: No complications documented.

## 2019-10-26 NOTE — Op Note (Signed)
OPERATIVE REPORT  DATE OF SURGERY: 10/26/2019  PATIENT:  Jacob Hebert,  19 y.o. male  PRE-OPERATIVE DIAGNOSIS:  nasal fracture  POST-OPERATIVE DIAGNOSIS:  nasal fracture  PROCEDURE:  Procedure(s): CLOSED REDUCTION NASAL FRACTURE  SURGEON:  Susy Frizzle, MD  ASSISTANTS: None  ANESTHESIA:   General   EBL: Less than 10 ml  DRAINS: None  LOCAL MEDICATIONS USED: 1% Xylocaine with epinephrine  SPECIMEN:  none  COUNTS:  Correct  PROCEDURE DETAILS: The patient was taken to the operating room and placed on the operating table in the supine position. Following induction of general endotracheal anesthesia, the nose was draped in a standard fashion.  Oxymetazoline spray was used preoperatively.  1% Xylocaine with epinephrine was infiltrated into the left superior septum and nasal bone mucosa and skin sides.  A butter knife elevator was used to elevate the left nasal bone which was depressed, with simultaneous digital pressure on the right side and there was nice reduction of the fracture with good stable bone positioning.  Small amount of blood was suctioned from the nasal cavity.  There is no active bleeding.  The nasal dorsum was dressed with benzoin Steri-Strips and an Aquaplast splint.  Patient was awakened extubated and transferred to recovery in stable condition.    PATIENT DISPOSITION:  To PACU, stable

## 2019-10-26 NOTE — Discharge Instructions (Signed)
  Post Anesthesia Home Care Instructions  Activity: Get plenty of rest for the remainder of the day. A responsible individual must stay with you for 24 hours following the procedure.  For the next 24 hours, DO NOT: -Drive a car -Advertising copywriter -Drink alcoholic beverages -Take any medication unless instructed by your physician -Make any legal decisions or sign important papers.  Meals: Start with liquid foods such as gelatin or soup. Progress to regular foods as tolerated. Avoid greasy, spicy, heavy foods. If nausea and/or vomiting occur, drink only clear liquids until the nausea and/or vomiting subsides. Call your physician if vomiting continues.  Special Instructions/Symptoms: Your throat may feel dry or sore from the anesthesia or the breathing tube placed in your throat during surgery. If this causes discomfort, gargle with warm salt water. The discomfort should disappear within 24 hours.  If you had a scopolamine patch placed behind your ear for the management of post- operative nausea and/or vomiting:  1. The medication in the patch is effective for 72 hours, after which it should be removed.  Wrap patch in a tissue and discard in the trash. Wash hands thoroughly with soap and water. 2. You may remove the patch earlier than 72 hours if you experience unpleasant side effects which may include dry mouth, dizziness or visual disturbances. 3. Avoid touching the patch. Wash your hands with soap and water after contact with the patch.       Okay to use Tylenol and/or Motrin as needed.  The splint will fall off in 2-7 days.  Better to keep it dry while it is on.  Be extra cautious for the next few weeks to not being into anything.  Follow-up if you have any additional problems.

## 2019-10-26 NOTE — Anesthesia Preprocedure Evaluation (Signed)
Anesthesia Evaluation  Patient identified by MRN, date of birth, ID band Patient awake    Reviewed: Allergy & Precautions, NPO status , Patient's Chart, lab work & pertinent test results  Airway Mallampati: I  TM Distance: >3 FB Neck ROM: Full    Dental   Pulmonary asthma , Current Smoker,    Pulmonary exam normal        Cardiovascular Normal cardiovascular exam     Neuro/Psych  Headaches,    GI/Hepatic   Endo/Other    Renal/GU      Musculoskeletal   Abdominal   Peds  Hematology   Anesthesia Other Findings   Reproductive/Obstetrics                             Anesthesia Physical Anesthesia Plan  ASA: II  Anesthesia Plan: General   Post-op Pain Management:    Induction: Intravenous  PONV Risk Score and Plan: Ondansetron and Midazolam  Airway Management Planned: LMA  Additional Equipment:   Intra-op Plan:   Post-operative Plan: Extubation in OR  Informed Consent: I have reviewed the patients History and Physical, chart, labs and discussed the procedure including the risks, benefits and alternatives for the proposed anesthesia with the patient or authorized representative who has indicated his/her understanding and acceptance.       Plan Discussed with: CRNA and Surgeon  Anesthesia Plan Comments:         Anesthesia Quick Evaluation

## 2019-10-26 NOTE — Interval H&P Note (Signed)
History and Physical Interval Note:  10/26/2019 8:59 AM  Vista Deck  has presented today for surgery, with the diagnosis of nasal fracture.  The various methods of treatment have been discussed with the patient and family. After consideration of risks, benefits and other options for treatment, the patient has consented to  Procedure(s): CLOSED REDUCTION NASAL FRACTURE (N/A) as a surgical intervention.  The patient's history has been reviewed, patient examined, no change in status, stable for surgery.  I have reviewed the patient's chart and labs.  Questions were answered to the patient's satisfaction.     Jacob Hebert

## 2019-10-29 ENCOUNTER — Encounter (HOSPITAL_BASED_OUTPATIENT_CLINIC_OR_DEPARTMENT_OTHER): Payer: Self-pay | Admitting: Otolaryngology

## 2020-09-24 ENCOUNTER — Ambulatory Visit (INDEPENDENT_AMBULATORY_CARE_PROVIDER_SITE_OTHER): Payer: Self-pay | Admitting: Family Medicine

## 2020-09-24 ENCOUNTER — Other Ambulatory Visit: Payer: Self-pay

## 2020-09-24 ENCOUNTER — Encounter: Payer: Self-pay | Admitting: Family Medicine

## 2020-09-24 VITALS — BP 120/67 | HR 54 | Ht 74.25 in | Wt 192.2 lb

## 2020-09-24 DIAGNOSIS — Z025 Encounter for examination for participation in sport: Secondary | ICD-10-CM

## 2020-09-24 NOTE — Progress Notes (Signed)
Office Visit Note   Patient: Jacob Hebert           Date of Birth: 10-Jan-2001           MRN: [id number] Visit Date: 09/24/2020 Requested by: Shelva Majestic, MD [address] [place],  [place] [address] PCP: Shelva Majestic, MD  Subjective: Chief Complaint  Patient presents with   SPORTS PHYSICAL    Baseball -- pitcher    HPI: He is here for sports physical.  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  He has a history of Tommy John surgery right elbow a little more than a year ago.  He has been released by Dr. Amanda Pea as of this past spring.  He has not regained for pitching velocity, but is trying 85 mph now.  He is not having pain.  He has a history of childhood asthma but has not had troubles with that for a long time.  He gets occasional migraine headaches that go away after a few minutes.  He has had some broken bones in the past and a few concussions but nothing in the past 5 years.  He is otherwise feeling well, in very good health.                ROS:   All other systems were reviewed and are negative.  Objective: Vital Signs: BP 120/67 (BP Location: Left Arm, Patient Position: Sitting, Cuff Size: Normal)   Pulse (!) 54   Ht 6' 2.25" (1.886 m)   Wt 192 lb 3.2 oz (87.2 kg)   BMI 24.51 kg/m   Physical Exam:  General:  Alert and oriented, in no acute distress. Pulm:  Breathing unlabored. Psy:  Normal mood, congruent affect. Skin: No suspicious lesions HEENT:  [place]/AT, PERRLA, EOM Full, no nystagmus.  Funduscopic examination within normal limits.  No conjunctival erythema.  Tympanic membranes are pearly gray with normal landmarks.  External ear canals are normal.  Nasal passages are clear.  Oropharynx is clear.  No significant lymphadenopathy.  No thyromegaly or nodules.  2+ carotid pulses without bruits. CV: Regular rate and rhythm without murmurs, rubs, or gallops.  No peripheral edema.  2+ radial and posterior tibial pulses. Lungs: Clear to auscultation throughout  with no wheezing or areas of consolidation. Abd: Bowel sounds are active, no hepatosplenomegaly or masses.  Soft and nontender.  No audible bruits.  No evidence of ascites. Extremities: Good rotator cuff strength.  Elbow ligaments are stable. Back: He has mild thoracolumbar scoliosis.     Imaging: No results found.  Assessment & Plan: Sports physical -He is cleared for full participation without restriction.  Return yearly.     Procedures: No procedures performed        PMFS History: Patient Active Problem List   Diagnosis Date Noted   Insomnia 07/05/2017   Seasonal allergies 07/05/2017   Intermittent diarrhea 07/05/2017   Asthma, mild intermittent    Migraines    Scapular dyskinesis 11/18/2016   Past Medical History:  Diagnosis Date   Asthma    sparing albuterol- mainly with sickness. has not had issues with sports in years.    Migraines    rx through Dr. Chestine Spore. about once a month    Family History  Problem Relation Age of Onset   Arthritis Mother    Atrial fibrillation Mother    Arthritis Father    Hyperlipidemia Father    GER disease Father    Colon polyps Father  age  47 it appears   Asthma Brother        age 31 in 2019   Arthritis Maternal Grandmother    Diabetes Maternal Grandmother    Dementia Maternal Grandmother    Hearing loss Maternal Grandfather     Past Surgical History:  Procedure Laterality Date   CLOSED REDUCTION NASAL FRACTURE N/A 10/26/2019   Procedure: CLOSED REDUCTION NASAL FRACTURE;  Surgeon: Serena Colonel, MD;  Location: [facility];  Service: ENT;  Laterality: N/A;   ULNAR COLLATERAL LIGAMENT RECONSTRUCTION     Tommy John surgery   WISDOM TOOTH EXTRACTION     Social History   Occupational History   Not on file  Tobacco Use   Smoking status: Some Days    Types: E-cigarettes   Smokeless tobacco: Never   Tobacco comments:    vapes some- advised against  Vaping Use   Vaping Use: Every day  Substance and  Sexual Activity   Alcohol use: Yes    Alcohol/week: 7.0 standard drinks    Types: 7 Standard drinks or equivalent per week    Comment: never drinks and drives   Drug use: Never   Sexual activity: Yes

## 2021-08-04 ENCOUNTER — Encounter (HOSPITAL_BASED_OUTPATIENT_CLINIC_OR_DEPARTMENT_OTHER): Payer: Self-pay | Admitting: Obstetrics and Gynecology

## 2021-08-04 ENCOUNTER — Emergency Department (HOSPITAL_BASED_OUTPATIENT_CLINIC_OR_DEPARTMENT_OTHER)
Admission: EM | Admit: 2021-08-04 | Discharge: 2021-08-04 | Disposition: A | Discharge status: Home / Self Care | Payer: 59 | Attending: Emergency Medicine | Admitting: Emergency Medicine

## 2021-08-04 ENCOUNTER — Other Ambulatory Visit: Payer: Self-pay

## 2021-08-04 DIAGNOSIS — Y9234 Swimming pool (public) as the place of occurrence of the external cause: Secondary | ICD-10-CM | POA: Insufficient documentation

## 2021-08-04 DIAGNOSIS — W16032A Fall into swimming pool striking wall causing other injury, initial encounter: Secondary | ICD-10-CM | POA: Insufficient documentation

## 2021-08-04 DIAGNOSIS — S0990XA Unspecified injury of head, initial encounter: Secondary | ICD-10-CM | POA: Diagnosis present

## 2021-08-04 DIAGNOSIS — S0101XA Laceration without foreign body of scalp, initial encounter: Secondary | ICD-10-CM | POA: Diagnosis not present

## 2021-08-04 DIAGNOSIS — S0181XA Laceration without foreign body of other part of head, initial encounter: Secondary | ICD-10-CM

## 2021-08-04 MED ORDER — LIDOCAINE-EPINEPHRINE 2 %-1:100000 IJ SOLN
10.0000 mL | Freq: Once | INTRAMUSCULAR | Status: AC
  Administered 2021-08-04: 10 mL via INTRADERMAL
  Filled 2021-08-04: qty 10.2

## 2021-08-04 MED ORDER — LIDOCAINE-EPINEPHRINE (PF) 2 %-1:200000 IJ SOLN
INTRAMUSCULAR | Status: AC
  Filled 2021-08-04: qty 20
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
## 2021-08-04 NOTE — ED Notes (Signed)
Reviewed AVS/discharge instruction with patient. Time allotted for and all questions answered. Patient is agreeable for d/c and escorted to ed exit by staff.  

## 2021-08-04 NOTE — Discharge Instructions (Addendum)
You had 6 stitches placed.  This should be removed in 5 days.  This can be done at an urgent care doctor's office.  Keep your wound clean and dry for the next 2 days.  After that you can shower regular soap and water.  Keep the wound covered up for 1 month, avoiding direct sunlight or ultraviolet light, which can worsen scar formation.

## 2021-08-05 ENCOUNTER — Telehealth: Payer: Self-pay | Admitting: Family Medicine

## 2021-08-05 NOTE — Telephone Encounter (Signed)
Patient has been scheduled for appointment on 08/10/21 with Dulce Sellar

## 2021-08-05 NOTE — Telephone Encounter (Signed)
Yes, can be seen by another provider.
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
## 2021-08-10 ENCOUNTER — Encounter: Payer: Self-pay | Admitting: Family

## 2021-08-10 ENCOUNTER — Ambulatory Visit (INDEPENDENT_AMBULATORY_CARE_PROVIDER_SITE_OTHER): Payer: 59 | Admitting: Family

## 2021-08-10 VITALS — BP 120/72 | HR 61 | Temp 97.1°F | Ht 75.0 in | Wt 201.4 lb

## 2021-08-10 DIAGNOSIS — S0101XA Laceration without foreign body of scalp, initial encounter: Secondary | ICD-10-CM | POA: Diagnosis not present
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
## 2021-08-10 NOTE — Patient Instructions (Signed)
It was very nice to see you today!   I removed 6 sutures from your head wound today, with one small pinpoint area of bleeding.  As discussed, leave the bandage on until tomorrow (about 24hours) and then clean gently with the saline solution I gave you today. Dry well, and ok to apply a very thin layer of the Bacitracin ointment provided to you today and recover for another day. As long as the wound is scabbed over and not draining after 2 days, ok to leave bandage off and clean normally with soap and water. Do not pick at scab! This can cause scarring. OK to apply vaseline daily to soften and it will eventually slough off.  Call the office if any concerns.    PLEASE NOTE:  If you had any lab tests please let us know if you have not heard back within a few days. You may see your results on MyChart before we have a chance to review them but we will give you a call once they are reviewed by [place]. If we ordered any referrals today, please let us know if you have not heard from their office within the next week.

## 2021-10-23 ENCOUNTER — Ambulatory Visit: Payer: 59 | Admitting: Family

## 2021-10-26 ENCOUNTER — Encounter: Payer: Self-pay | Admitting: *Deleted

## 2021-11-04 ENCOUNTER — Encounter: Payer: 59 | Admitting: Family Medicine

## 2022-01-14 ENCOUNTER — Encounter: Payer: Self-pay | Admitting: *Deleted

## 2022-01-14 ENCOUNTER — Other Ambulatory Visit: Payer: Self-pay

## 2022-01-14 ENCOUNTER — Emergency Department (HOSPITAL_BASED_OUTPATIENT_CLINIC_OR_DEPARTMENT_OTHER)
Admission: EM | Admit: 2022-01-14 | Discharge: 2022-01-14 | Disposition: A | Discharge status: Home / Self Care | Payer: 59 | Attending: Emergency Medicine | Admitting: Emergency Medicine

## 2022-01-14 ENCOUNTER — Encounter (HOSPITAL_BASED_OUTPATIENT_CLINIC_OR_DEPARTMENT_OTHER): Payer: Self-pay

## 2022-01-14 DIAGNOSIS — K2920 Alcoholic gastritis without bleeding: Secondary | ICD-10-CM | POA: Diagnosis not present

## 2022-01-14 DIAGNOSIS — E860 Dehydration: Secondary | ICD-10-CM | POA: Insufficient documentation

## 2022-01-14 DIAGNOSIS — R112 Nausea with vomiting, unspecified: Secondary | ICD-10-CM | POA: Diagnosis present

## 2022-01-14 LAB — CBC
HCT: 46.4 % (ref 39.0–52.0)
Hemoglobin: 17.1 g/dL — ABNORMAL HIGH (ref 13.0–17.0)
MCH: 30.2 pg (ref 26.0–34.0)
MCHC: 36.9 g/dL — ABNORMAL HIGH (ref 30.0–36.0)
MCV: 81.8 fL (ref 80.0–100.0)
Platelets: 294 10*3/uL (ref 150–400)
RBC: 5.67 MIL/uL (ref 4.22–5.81)
RDW: 12.9 % (ref 11.5–15.5)
WBC: 12.6 10*3/uL — ABNORMAL HIGH (ref 4.0–10.5)
nRBC: 0 % (ref 0.0–0.2)

## 2022-01-14 LAB — URINALYSIS, ROUTINE W REFLEX MICROSCOPIC
Bilirubin Urine: NEGATIVE
Glucose, UA: NEGATIVE mg/dL
Hgb urine dipstick: NEGATIVE
Ketones, ur: 40 mg/dL — AB
Leukocytes,Ua: NEGATIVE
Nitrite: NEGATIVE
Specific Gravity, Urine: 1.021 (ref 1.005–1.030)
pH: 6 (ref 5.0–8.0)

## 2022-01-14 LAB — BASIC METABOLIC PANEL
Anion gap: 14 (ref 5–15)
BUN: 20 mg/dL (ref 6–20)
CO2: 24 mmol/L (ref 22–32)
Calcium: 9.6 mg/dL (ref 8.9–10.3)
Chloride: 100 mmol/L (ref 98–111)
Creatinine, Ser: 0.97 mg/dL (ref 0.61–1.24)
GFR, Estimated: >60 mL/min (ref >60)
Glucose, Bld: 76 mg/dL (ref 70–99)
Potassium: 4.7 mmol/L (ref 3.5–5.1)
Sodium: 138 mmol/L (ref 135–145)

## 2022-01-14 LAB — COMPREHENSIVE METABOLIC PANEL
ALT: 28 U/L (ref 0–44)
AST: 38 U/L (ref 15–41)
Albumin: 5.4 g/dL — ABNORMAL HIGH (ref 3.5–5.0)
Alkaline Phosphatase: 79 U/L (ref 38–126)
Anion gap: 21 — ABNORMAL HIGH (ref 5–15)
BUN: 20 mg/dL (ref 6–20)
CO2: 17 mmol/L — ABNORMAL LOW (ref 22–32)
Calcium: 9.7 mg/dL (ref 8.9–10.3)
Chloride: 99 mmol/L (ref 98–111)
Creatinine, Ser: 1.23 mg/dL (ref 0.61–1.24)
GFR, Estimated: >60 mL/min (ref >60)
Glucose, Bld: 106 mg/dL — ABNORMAL HIGH (ref 70–99)
Potassium: 3.8 mmol/L (ref 3.5–5.1)
Sodium: 137 mmol/L (ref 135–145)
Total Bilirubin: 1.2 mg/dL (ref 0.3–1.2)
Total Protein: 8.3 g/dL — ABNORMAL HIGH (ref 6.5–8.1)

## 2022-01-14 LAB — LIPASE, BLOOD: Lipase: <10 U/L — ABNORMAL LOW (ref 11–51)

## 2022-01-14 MED ORDER — LACTATED RINGERS IV BOLUS
1000.0000 mL | Freq: Once | INTRAVENOUS | Status: AC
  Administered 2022-01-14: 1000 mL via INTRAVENOUS

## 2022-01-14 MED ORDER — ONDANSETRON 8 MG PO TBDP: 8.0000 mg | ORAL_TABLET | Freq: Three times a day (TID) | ORAL | 0 refills | Status: DC | PRN

## 2022-01-14 MED ORDER — OMEPRAZOLE 20 MG PO CPDR: 20.0000 mg | DELAYED_RELEASE_CAPSULE | Freq: Every day | ORAL | 0 refills | Status: DC

## 2022-01-14 MED ORDER — PANTOPRAZOLE SODIUM 40 MG IV SOLR
40.0000 mg | Freq: Once | INTRAVENOUS | Status: AC
  Administered 2022-01-14: 40 mg via INTRAVENOUS
  Filled 2022-01-14: qty 10

## 2022-01-14 MED ORDER — ONDANSETRON HCL 4 MG/2ML IJ SOLN
4.0000 mg | Freq: Once | INTRAMUSCULAR | Status: AC
  Administered 2022-01-14: 4 mg via INTRAVENOUS
  Filled 2022-01-14: qty 2

## 2022-01-14 NOTE — ED Notes (Signed)
Water given to patient 

## 2022-01-14 NOTE — ED Triage Notes (Signed)
Patient here POV from Home.  Endorses Drinking 13 White Claws Last PM. Soon After the Patient has had consistent vomiting.   No Diarrhea.   NAD Noted during Triage. A&Ox4. GCS 15. Ambulatory.

## 2022-01-14 NOTE — ED Provider Notes (Signed)
Care assumed from Dr. Denton Lank.  Patient with upper abdominal pain, nausea and vomiting after heavy alcohol intake last night.  Labs with mild anion gap acidosis.  Awaiting p.o. challenge and hydration.  Patient feels improved and is tolerating p.o.  Abdomen soft without peritoneal signs.  Anion gap has improved and bicarb is improved.  Discussed reduction of alcohol intake.  Will discharge home with antiemetics and PCP follow-up.  Return precautions discussed   Glynn Octave, MD 01/14/22 1905

## 2022-01-14 NOTE — ED Provider Notes (Signed)
[facility] EMERGENCY DEPT Provider Note   CSN: [id number] Arrival date & time: 01/14/22  1232     History  Chief Complaint  Patient presents with   Vomiting    Jacob Hebert is a 21 y.o. male.  Pt with nausea and vomiting. Symptoms acute onset this AM, states drank ~ 21 'White Claws' last PM. Emesis not bloody or bilious. No diarrhea. No abd pain or distension. No trauma or fall.  Mild headache. States felt fine, at baseline, prior to drinking last night, no preceding symptoms. Denies chest pain or sob. No cough or uri symptoms. Denies daily/heavy etoh use. States one prior episode nv post drinking too much.   The history is provided by the patient, a parent and medical records.       Home Medications Prior to Admission medications   Medication Sig Start Date End Date Taking? Authorizing Provider  albuterol (VENTOLIN HFA) 108 (90 Base) MCG/ACT inhaler     [provider]  levocetirizine (XYZAL) 5 MG tablet Take 5 mg by mouth every evening.    [provider]      Allergies    Bioflavonoids and Citrus    Review of Systems   Review of Systems  Constitutional:  Negative for fever.  HENT:  Negative for sore throat.   Eyes:  Negative for visual disturbance.  Respiratory:  Negative for cough and shortness of breath.   Cardiovascular:  Negative for chest pain.  Gastrointestinal:  Positive for nausea and vomiting.  Genitourinary:  Negative for dysuria.  Musculoskeletal:  Negative for back pain and neck pain.  Skin:  Negative for rash.  Neurological:  Negative for headaches.  Hematological:  Does not bruise/bleed easily.  Psychiatric/Behavioral:  Negative for confusion.     Physical Exam Updated Vital Signs BP 135/73 (BP Location: Right Arm)   Pulse 87   Temp (!) 97.2 F (36.2 C)   Resp 18   Ht 1.905 m (6\' 3" )   Wt 91.3 kg   SpO2 100%   BMI 25.16 kg/m  Physical Exam Vitals and nursing note reviewed.  Constitutional:       Appearance: Normal appearance. He is well-developed.  HENT:     Head: Atraumatic.     Nose: Nose normal.     Mouth/Throat:     Mouth: Mucous membranes are moist.  Eyes:     General: No scleral icterus.    Conjunctiva/sclera: Conjunctivae normal.     Pupils: Pupils are equal, round, and reactive to light.  Neck:     Trachea: No tracheal deviation.  Cardiovascular:     Rate and Rhythm: Normal rate and regular rhythm.     Pulses: Normal pulses.     Heart sounds: Normal heart sounds. No murmur heard.    No friction rub. No gallop.  Pulmonary:     Effort: Pulmonary effort is normal. No accessory muscle usage or respiratory distress.     Breath sounds: Normal breath sounds.  Abdominal:     General: There is no distension.     Palpations: Abdomen is soft.     Tenderness: There is no abdominal tenderness. There is no guarding.  Musculoskeletal:        General: No swelling.     Cervical back: Normal range of motion and neck supple. No rigidity.  Skin:    General: Skin is warm and dry.     Findings: No rash.  Neurological:     Mental Status: He is alert.  Comments: Alert, speech clear. Motor/sens grossly intact bil. Steady gait.   Psychiatric:        Mood and Affect: Mood normal.     ED Results / Procedures / Treatments   Labs (all labs ordered are listed, but only abnormal results are displayed) Results for orders placed or performed during the hospital encounter of 01/14/22  Lipase, blood  Result Value Ref Range   Lipase <10 (L) 11 - 51 U/L  Comprehensive metabolic panel  Result Value Ref Range   Sodium 137 135 - 145 mmol/L   Potassium 3.8 3.5 - 5.1 mmol/L   Chloride 99 98 - 111 mmol/L   CO2 17 (L) 22 - 32 mmol/L   Glucose, Bld 106 (H) 70 - 99 mg/dL   BUN 20 6 - 20 mg/dL   Creatinine, Ser 8.34 0.61 - 1.24 mg/dL   Calcium 9.7 8.9 - 19.6 mg/dL   Total Protein 8.3 (H) 6.5 - 8.1 g/dL   Albumin 5.4 (H) 3.5 - 5.0 g/dL   AST 38 15 - 41 U/L   ALT 28 0 - 44 U/L   Alkaline  Phosphatase 79 38 - 126 U/L   Total Bilirubin 1.2 0.3 - 1.2 mg/dL   GFR, Estimated >22 >29 mL/min   Anion gap 21 (H) 5 - 15  CBC  Result Value Ref Range   WBC 12.6 (H) 4.0 - 10.5 K/uL   RBC 5.67 4.22 - 5.81 MIL/uL   Hemoglobin 17.1 (H) 13.0 - 17.0 g/dL   HCT 79.8 92.1 - 19.4 %   MCV 81.8 80.0 - 100.0 fL   MCH 30.2 26.0 - 34.0 pg   MCHC 36.9 (H) 30.0 - 36.0 g/dL   RDW 17.4 08.1 - 44.8 %   Platelets 294 150 - 400 K/uL   nRBC 0.0 0.0 - 0.2 %     EKG None  Radiology No results found.  Procedures Procedures    Medications Ordered in ED Medications  pantoprazole (PROTONIX) injection 40 mg (has no administration in time range)  ondansetron (ZOFRAN) injection 4 mg (has no administration in time range)  lactated ringers bolus 1,000 mL (has no administration in time range)    ED Course/ Medical Decision Making/ A&P                           Medical Decision Making Problems Addressed: Acute alcoholic gastritis without hemorrhage: acute illness or injury with systemic symptoms that poses a threat to life or bodily functions Dehydration: acute illness or injury with systemic symptoms Nausea and vomiting in adult: acute illness or injury with systemic symptoms  Amount and/or Complexity of Data Reviewed Independent Historian:     Details: Family, hx External Data Reviewed: notes. Labs: ordered. Decision-making details documented in ED Course.  Risk Prescription drug management. Decision regarding hospitalization.   Iv ns. Continuous pulse ox and cardiac monitoring. Labs ordered/sent.   Diff dx includes nv related to excess etoh use, gastritis, gastroenteritis, dehydration/AKI, etc. - dispo decision including potential need for admission if aki, intractable nv considered - will get labs and give fluids/meds, and reassess.   Reviewed nursing notes and prior charts for additional history. External reports reviewed. Additional history from: parent  Cardiac monitor: sinus  rhythm, rate 87.  Labs reviewed/interpreted by me - Hct normal. Hco3 sl low ?dehydration. LR bolus. Zofran iv. Protonix iv.   Signed out to oncoming EDP to give ivf, po trial, recheck and dispo appropriately.  Final Clinical Impression(s) / ED Diagnoses Final diagnoses:  None    Rx / DC Orders ED Discharge Orders     None         Cathren Laine, MD 01/21/22 1058

## 2022-01-14 NOTE — Discharge Instructions (Signed)
It was our pleasure to provide your ER care today - we hope that you feel better.  Drink plenty of fluids/stay well hydrated.   Take zofran as need for nausea. You may also try pepcid and maalox for gi symptom relief.   Return to ER if worse, new symptoms, fevers, new or worsening or severe abdominal pain, persistent vomiting, or other concern.

## 2022-01-15 ENCOUNTER — Telehealth: Payer: Self-pay

## 2022-01-15 NOTE — Telephone Encounter (Signed)
Transition Care Management Follow-up Telephone Call Date of discharge and from where: 01/14/2022 [facility] How have you been since you were released from the hospital? Better, throat is just a little sore Any questions or concerns? No  Items Reviewed: Did the pt receive and understand the discharge instructions provided? Yes  Medications obtained and verified? Yes  Other? No  Any new allergies since your discharge? No  Dietary orders reviewed? Yes Do you have support at home? Yes   Home Care and Equipment/Supplies: Were home health services ordered? not applicable If so, what is the name of the agency? N/a  Has the agency set up a time to come to the patient's home? not applicable Were any new equipment or medical supplies ordered?  No What is the name of the medical supply agency? N/a Were you able to get the supplies/equipment? not applicable Do you have any questions related to the use of the equipment or supplies? No  Functional Questionnaire: (I = Independent and D = Dependent) ADLs: I  Bathing/Dressing- I  Meal Prep- I  Eating- I  Maintaining continence- I  Transferring/Ambulation- I  Managing Meds- I  Follow up appointments reviewed:  PCP Hospital f/u appt confirmed? No  Patient declines follow up appointment Are transportation arrangements needed? No  If their condition worsens, is the pt aware to call PCP or go to the Emergency Dept.? Yes Was the patient provided with contact information for the PCP's office or ED? Yes Was to pt encouraged to call back with questions or concerns? Yes

## 2022-02-23 ENCOUNTER — Encounter: Payer: 59 | Admitting: Family Medicine

## 2022-06-21 ENCOUNTER — Ambulatory Visit (INDEPENDENT_AMBULATORY_CARE_PROVIDER_SITE_OTHER): Payer: 59 | Admitting: Family Medicine

## 2022-06-21 ENCOUNTER — Encounter: Payer: Self-pay | Admitting: Family Medicine

## 2022-06-21 VITALS — BP 120/62 | HR 57 | Temp 98.4°F | Ht 75.0 in | Wt 200.4 lb

## 2022-06-21 DIAGNOSIS — Z0000 Encounter for general adult medical examination without abnormal findings: Secondary | ICD-10-CM

## 2022-06-21 DIAGNOSIS — Z23 Encounter for immunization: Secondary | ICD-10-CM | POA: Diagnosis not present

## 2022-06-21 MED ORDER — DEXAMETHASONE 0.5 MG/5ML PO ELIX
ORAL_SOLUTION | ORAL | 5 refills | Status: DC
  Filled 2022-12-01: qty 237, 30d supply, fill #0

## 2022-06-21 NOTE — Progress Notes (Signed)
Phone: [phone]    Subjective:  Patient presents today for their annual physical. Chief complaint-noted.   See problem oriented charting- ROS- full  review of systems was completed and negative  except for: canker sores recurrent for years   The following were reviewed and entered/updated in epic: Past Medical History:  Diagnosis Date   Asthma    sparing albuterol- mainly with sickness. has not had issues with sports in years.    Migraines    rx through Dr. Chestine Spore. about once a month   Patient Active Problem List   Diagnosis Date Noted   Insomnia 07/05/2017    Priority: Medium    Asthma, mild intermittent     Priority: Medium    Migraines     Priority: Medium    Seasonal allergies 07/05/2017    Priority: Low   Intermittent diarrhea 07/05/2017    Priority: Low   Scapular dyskinesis 11/18/2016    Priority: Low   Closed fracture of nasal bone 10/23/2019   Pain in elbow 08/14/2018   Past Surgical History:  Procedure Laterality Date   CLOSED REDUCTION NASAL FRACTURE N/A 10/26/2019   Procedure: CLOSED REDUCTION NASAL FRACTURE;  Surgeon: Serena Colonel, MD;  Location: [facility];  Service: ENT;  Laterality: N/A;   ULNAR COLLATERAL LIGAMENT RECONSTRUCTION     Tommy John surgery   WISDOM TOOTH EXTRACTION      Family History  Problem Relation Age of Onset   Arthritis Mother    Atrial fibrillation Mother    Arthritis Father    Hyperlipidemia Father    GER disease Father    Colon polyps Father        age  47 it appears   Asthma Brother        age 78 in 2019   Arthritis Maternal Grandmother    Diabetes Maternal Grandmother    Dementia Maternal Grandmother    Hearing loss Maternal Grandfather     Medications- reviewed and updated Current Outpatient Medications  Medication Sig Dispense Refill   albuterol (VENTOLIN HFA) 108 (90 Base) MCG/ACT inhaler      levocetirizine (XYZAL) 5 MG tablet Take 5 mg by mouth every evening.     No current  facility-administered medications for this visit.    Allergies-reviewed and updated Allergies  Allergen Reactions   Bioflavonoids     Other reaction(s): abdominal pain    Social History   Social History Narrative   Single. Dating   Lives with mom, dad, little brother- gap year in 2024.  Dad is patient of Dr. Durene Cal      At [employer] in spring 2024-will finish spring 2025. 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 Prior [employer]   Started at [employer] baseball.        Hobbies: time with friends, pool in summer      Objective:  BP 120/62   Pulse (!) 57   Temp 98.4 F (36.9 C)   Ht 6\' 3"  (1.905 m)   Wt 200 lb 6.4 oz (90.9 kg)   SpO2 98%   BMI 25.05 kg/m  Gen: NAD, resting comfortably HEENT: Mucous membranes are moist. Oropharynx normal Neck: no thyromegaly CV: RRR no murmurs rubs or gallops Lungs: CTAB no crackles, wheeze, rhonchi Abdomen: soft/nontender/nondistended/normal bowel sounds. No rebound or guarding.  Ext: no edema Skin: warm, dry Neuro: grossly normal, moves all extremities, PERRLA    Assessment and Plan:  22 y.o. male presenting for annual physical.  Health Maintenance counseling:  1. Anticipatory guidance: Patient counseled regarding regular dental exams -q6 months, eye exams -regularly for contacts,  avoiding smoking and second hand smoke- does vape and encouraged to quit , limiting alcohol to 2 beverages per day- in general, no illicit drugs- other than sparing marijuana- encouraged not to smoke it for lung health.   2. Risk factor reduction:  Advised patient of need for regular exercise and diet rich and fruits and vegetables to reduce risk of heart attack and stroke.  Exercise- still excellent with baseline.  Diet/weight management-healthy weight for muscle mass.  Wt Readings from Last 3 Encounters:  06/21/22 200 lb 6.4 oz (90.9 kg)  01/14/22 201 lb 4.5 oz (91.3 kg)  08/10/21 201 lb 6 oz (91.3 kg)  3.  Immunizations/screenings/ancillary studies- had all childhood vaccines, declining COVID vaccine, Tetanus, Diphtheria, and Pertussis (Tdap) today. Declines HCV screen Immunization History  Administered Date(s) Administered   Hpv-Unspecified 10/14/2011, 01/04/2012, 10/09/2012   Influenza,inj,Quad PF,6+ Mos 12/28/2017, 12/19/2018   Pneumococcal-Unspecified 06/13/1998, 08/13/1998, 10/14/1998, 07/22/2019   Tdap 07/01/2009  4. Prostate cancer screening- no family history, start at age 64   5. Colon cancer screening - no early family history-  start at age 92  6. Skin cancer screening/prevention- no recent derm visits. advised regular sunscreen use. Denies worrisome, changing, or new skin lesions.  7. Testicular cancer screening- advised monthly self exams - had not been doing 8. STD screening- patient opts out- did have unprotected with a long term girlfriend but he is not concerned about exposure - wants to hold off 9. 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 Has had since about age 2 most of time- never more than 1-2 at a time and not more than 1 cm  #screening hyperlipidemia- declines for now  #labs in December when ill largely reassuring- other than white count slighlty high but was ill- prefers to hold off  #reports sports physical with school- does not need  Recommended follow up: Return in about 1 year (around 06/21/2023) for physical or sooner if needed.Schedule b4 you leave.  Lab/Order associations: NO LABS- considering next year   ICD-10-CM   1. Preventative health care  Z00.00     2. Need for Tdap vaccination  Z23 Tdap vaccine greater than or equal to 7yo IM      Meds ordered this encounter  Medications   dexamethasone 0.5 MG/5ML elixir    Sig: 5 mL swish and spit three to four times daily if you have a canker sore    Dispense:  237 mL     Refill:  5    Return precautions advised.   Tana Conch, MD

## 2022-06-21 NOTE — Patient Instructions (Addendum)
Tetanus, Diphtheria, and Pertussis (Tdap) today  Consider anbesol in addition to the steroid rinse when you get canker sores  Recommended follow up: Return in about 1 year (around 06/21/2023) for physical or sooner if needed.Schedule b4 you leave.

## 2022-12-01 ENCOUNTER — Other Ambulatory Visit: Payer: Self-pay

## 2022-12-01 ENCOUNTER — Other Ambulatory Visit (HOSPITAL_BASED_OUTPATIENT_CLINIC_OR_DEPARTMENT_OTHER): Payer: Self-pay

## 2022-12-01 MED ORDER — ONDANSETRON 8 MG PO TBDP
8.0000 mg | ORAL_TABLET | Freq: Three times a day (TID) | ORAL | 0 refills | Status: AC | PRN
  Filled 2022-12-01: qty 1, 1d supply, fill #0

## 2022-12-02 ENCOUNTER — Other Ambulatory Visit: Payer: Self-pay

## 2022-12-06 ENCOUNTER — Other Ambulatory Visit (HOSPITAL_BASED_OUTPATIENT_CLINIC_OR_DEPARTMENT_OTHER): Payer: Self-pay

## 2023-06-23 ENCOUNTER — Encounter: Payer: 59 | Admitting: Family Medicine

## 2023-10-10 ENCOUNTER — Ambulatory Visit: Payer: Self-pay

## 2023-10-10 NOTE — Telephone Encounter (Signed)
 Pt seeing Dr. Kennyth 10/11/2023 at 1140. See triage note.

## 2023-10-10 NOTE — Telephone Encounter (Signed)
 I also have a 220 available as long as he does not have worsening symptoms as an option tomorrow

## 2023-10-10 NOTE — Telephone Encounter (Signed)
 FYI Only or Action Required?: FYI only for provider.  Patient was last seen in primary care on 06/21/2022 by Katrinka Garnette KIDD, MD.  Called Nurse Triage reporting Blood In Stools.  Symptoms began yesterday.  Interventions attempted: Rest, hydration, or home remedies.  Symptoms are: unchanged.  Triage Disposition: See Physician Within 24 Hours  Patient/caregiver understands and will follow disposition?: Yes  Copied from CRM [id number]. Topic: Clinical - Red Word Triage >> Oct 10, 2023  8:18 AM Robinson H wrote: [facility] that prompted transfer to Nurse Triage: Blood in stool, felt it was a lot of blood Reason for Disposition  MODERATE rectal bleeding (e.g., small blood clots, passing blood without stool, or toilet water turns red)  Answer Assessment - Initial Assessment Questions 1. APPEARANCE of BLOOD: What color is it? Is it passed separately, on the surface of the stool, or mixed in with the stool?      Bright red-told mother that the blood was in the toilet 2. AMOUNT: How much blood was passed?      Unsure of how much blood was passed.  3. FREQUENCY: How many times has blood been passed with the stools?      twice 4. ONSET: When was the blood first seen in the stools? (Days or weeks)      Started yesterday 5. DIARRHEA: Is there also some diarrhea? If Yes, ask: How many diarrhea stools in the past 24 hours?      Yes 6. CONSTIPATION: Do you have constipation? If Yes, ask: How bad is it?     Yes-unsure of how bad the constipation is 7. RECURRENT SYMPTOMS: Have you had blood in your stools before? If Yes, ask: When was the last time? and What happened that time?      no 8. BLOOD THINNERS: Do you take any blood thinners? (e.g., aspirin, clopidogrel / Plavix, coumadin, heparin). Notes: Other strong blood thinners include: Arixtra (fondaparinux), Eliquis (apixaban), Pradaxa (dabigatran), and Xarelto (rivaroxaban).     no 9. OTHER SYMPTOMS: Do you have any  other symptoms?  (e.g., abdomen pain, vomiting, dizziness, fever)     no  Protocols used: Rectal Bleeding-A-AH

## 2023-10-11 ENCOUNTER — Ambulatory Visit: Admitting: Family Medicine

## 2023-10-12 ENCOUNTER — Encounter: Payer: Self-pay | Admitting: Family Medicine

## 2023-10-12 ENCOUNTER — Ambulatory Visit (INDEPENDENT_AMBULATORY_CARE_PROVIDER_SITE_OTHER): Admitting: Family Medicine

## 2023-10-12 VITALS — BP 118/72 | HR 58 | Temp 97.3°F | Ht 75.0 in | Wt 213.6 lb

## 2023-10-12 DIAGNOSIS — R195 Other fecal abnormalities: Secondary | ICD-10-CM

## 2023-10-12 DIAGNOSIS — Z13220 Encounter for screening for lipoid disorders: Secondary | ICD-10-CM

## 2023-10-12 DIAGNOSIS — K625 Hemorrhage of anus and rectum: Secondary | ICD-10-CM | POA: Diagnosis not present

## 2023-10-12 LAB — COMPREHENSIVE METABOLIC PANEL WITH GFR
ALT: 13 U/L (ref 0–53)
AST: 13 U/L (ref 0–37)
Albumin: 4.8 g/dL (ref 3.5–5.2)
Alkaline Phosphatase: 78 U/L (ref 39–117)
BUN: 14 mg/dL (ref 6–23)
CO2: 25 meq/L (ref 19–32)
Calcium: 9.5 mg/dL (ref 8.4–10.5)
Chloride: 104 meq/L (ref 96–112)
Creatinine, Ser: 1.05 mg/dL (ref 0.40–1.50)
GFR: 98.66 mL/min (ref >60.00)
Glucose, Bld: 98 mg/dL (ref 70–99)
Potassium: 3.7 meq/L (ref 3.5–5.1)
Sodium: 138 meq/L (ref 135–145)
Total Bilirubin: 1.2 mg/dL (ref 0.2–1.2)
Total Protein: 7.4 g/dL (ref 6.0–8.3)

## 2023-10-12 LAB — CBC WITH DIFFERENTIAL/PLATELET
Basophils Absolute: 0 K/uL (ref 0.0–0.1)
Basophils Relative: 0.3 % (ref 0.0–3.0)
Eosinophils Absolute: 0 K/uL (ref 0.0–0.7)
Eosinophils Relative: 0.6 % (ref 0.0–5.0)
HCT: 43.3 % (ref 39.0–52.0)
Hemoglobin: 15.4 g/dL (ref 13.0–17.0)
Lymphocytes Relative: 23.4 % (ref 12.0–46.0)
Lymphs Abs: 1.4 K/uL (ref 0.7–4.0)
MCHC: 35.7 g/dL (ref 30.0–36.0)
MCV: 82.5 fl (ref 78.0–100.0)
Monocytes Absolute: 0.5 K/uL (ref 0.1–1.0)
Monocytes Relative: 8.1 % (ref 3.0–12.0)
Neutro Abs: 4 K/uL (ref 1.4–7.7)
Neutrophils Relative %: 67.6 % (ref 43.0–77.0)
Platelets: 243 K/uL (ref 150.0–400.0)
RBC: 5.25 Mil/uL (ref 4.22–5.81)
RDW: 13.5 % (ref 11.5–15.5)
WBC: 6 K/uL (ref 4.0–10.5)

## 2023-10-12 LAB — C-REACTIVE PROTEIN: CRP: <1 mg/dL (ref 0.5–20.0)

## 2023-10-12 LAB — LIPID PANEL
Cholesterol: 130 mg/dL (ref 0–200)
HDL: 39.2 mg/dL (ref >39.00)
LDL Cholesterol: 74 mg/dL (ref 0–99)
NonHDL: 90.39
Total CHOL/HDL Ratio: 3
Triglycerides: 82 mg/dL (ref 0.0–149.0)
VLDL: 16.4 mg/dL (ref 0.0–40.0)

## 2023-10-12 LAB — TSH: TSH: 1.2 u[IU]/mL (ref 0.35–5.50)

## 2023-10-12 LAB — SEDIMENTATION RATE: Sed Rate: 1 mm/h (ref 0–15)

## 2023-10-12 NOTE — Patient Instructions (Addendum)
 Please stop by lab before you go If you have mychart- we will send your results within 3 business days of us  receiving them.  If you do not have mychart- we will call you about results within 5 business days of us  receiving them.  *please also note that you will see labs on mychart as soon as they post. I will later go in and write notes on them- will say notes from Dr. Katrinka Finn GI contact Please call to schedule visit and/or procedure IF you do not hear within a week Address: [address] [place], [place], [place] [address] Phone: [phone]   I suspect internal hemorrhoids but you have had enough gastrointestinal issues over the years that id like to play this safe and just get GI doctor input -avoid straining for now until resolved for several weeks  Recommended follow up: Return for next already scheduled visit or sooner if needed. Definitely let me know ASAP if recurrent blood noted

## 2023-10-12 NOTE — Progress Notes (Signed)
 Phone [phone] In person visit   Subjective:   Jacob Hebert is a 23 y.o. year old very pleasant male patient who presents for/with See problem oriented charting Chief Complaint  Patient presents with   Blood In Stools    Two bloody bowel movements on Sunday, none since then; known IBS; no stomach pain, bloating, or cramps;     Discussed the use of AI scribe software for clinical note transcription with the patient, who gave verbal consent to proceed.  History of Present Illness   Jacob Hebert is a 23 year old male with a history of gastrointestinal issues who presents with rectal bleeding.  He noticed rectal bleeding on Sunday morning and toilet-he did not notice blood streaking on the stool but did note blood with wiping tissue paper. He experienced another bloody bowel movement in the early afternoon-once again the toilet bowl and with wiping but not streaking the stool. No associated pain, constipation, or bulging sensation in the rectum-he examined himself and did not feel anything.  He has a history of gastrointestinal issues and recalls a previous informal suggestion of possible irritable bowel syndrome (IBS) in 2019 with history of lower abdominal pain that could be relieved by bowel movement, although not formally diagnosed by a gastroenterologist or by me. His family history is significant for no colon cancer or early colon polyps but multiple family members have significant hemorrhoids including cousin and uncle and grandfather  No nausea, vomiting, dizziness, unintentional weight loss, or recent changes in bowel habits.  He does report history of regular frequent bowel movements that are on the loose side-in the past has noted some blood on toilet paper if having to wipe a lot but never having this quantity of blood before.  Outside of these episodes we checked for blood in stool in 2020 and this was negative on Hemoccult  Patient does report doing heavy lifting lately  with squats and dead lifts but since this he has tried to take it easy    Past Medical History-  Patient Active Problem List   Diagnosis Date Noted   Insomnia 07/05/2017    Priority: Medium    Asthma, mild intermittent     Priority: Medium    Migraines     Priority: Medium    Seasonal allergies 07/05/2017    Priority: Low   Intermittent diarrhea 07/05/2017    Priority: Low   Scapular dyskinesis 11/18/2016    Priority: Low   Closed fracture of nasal bone 10/23/2019   Pain in elbow 08/14/2018    Medications- reviewed and updated Current Outpatient Medications  Medication Sig Dispense Refill   albuterol  (VENTOLIN  HFA) 108 (90 Base) MCG/ACT inhaler      dexamethasone  0.5 MG/5ML elixir Use 5 mL swish and spit three to four times daily if you have a canker sore 237 mL 5   levocetirizine (XYZAL) 5 MG tablet Take 5 mg by mouth every evening.     ondansetron  (ZOFRAN -ODT) 8 MG disintegrating tablet Take 1 tablet (8 mg total) by mouth every 8 (eight) hours as needed for nausea or vomiting 10 tablet 0   No current facility-administered medications for this visit.     Objective:  BP 118/72 (BP Location: Left Arm, Patient Position: Sitting, Cuff Size: Normal)   Pulse (!) 58   Temp (!) 97.3 F (36.3 C) (Temporal)   Ht 6' 3 (1.905 m)   Wt 213 lb 9.6 oz (96.9 kg)   SpO2 97%   BMI 26.70 kg/m  Gen: NAD, resting comfortably CV: RRR no murmurs rubs or gallops Lungs: CTAB no crackles, wheeze, rhonchi Abdomen: soft/nontender/nondistended/normal bowel sounds. No rebound or guarding.  Rectal exam normal-no hemorrhoids or fissures noted.  Did not complete internal exam Ext: no edema Skin: warm, dry    Assessment and Plan   # Rectal bleeding suspected secondary to internal hemorrhoids Intermittent rectal bleeding with blood streaking on stool, no associated pain, constipation, or obvious external hemorrhoids felt. No significant changes in bowel habits. Previous negative fecal occult  blood test in 2020. Likely due to internal hemorrhoids, potentially exacerbated by increased intra-abdominal pressure from heavy lifting. Differential includes other GI issues such as inflammatory bowel disease, but hemorrhoids are the primary suspicion. Discussed potential for anemia due to blood loss and need for further evaluation by gastroenterology. Agrees to referral for further assessment. - Refer to gastroenterology for further evaluation and management. - Advise to avoid heavy lifting to prevent exacerbation of hemorrhoids. -We also will check sed rate and CRP to evaluate for potential inflammatory bowel disease.  Also check CBC to make sure no significant anemia - Father did have precancerous polyps at 84 per family history but no early family history.  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  Has been working with [facility] Dr. Talbert on this in the also noted some intermittent displacement of scapula but he is able to get this back in place-offered orthopedic referral but he wants to monitor for now as not overly bothersome  Recommended follow up: Return for next already scheduled visit or sooner if needed. Future Appointments  Date Time Provider Department Center  11/22/2023  1:20 PM Katrinka Garnette KIDD, MD [facility]    Lab/Order associations:   ICD-10-CM   1. BRBPR (bright red blood per rectum)  K62.5 Comprehensive metabolic panel with GFR    CBC with Differential/Platelet    Sedimentation rate    C-reactive protein    Ambulatory referral to Gastroenterology    2. Screening for hyperlipidemia  Z13.220 Lipid panel    3. Loose stools  R19.5 TSH     I personally spent a total of 25 minutes in the care of the patient today including preparing to see the patient, getting/reviewing separately obtained history,  performing a medically appropriate exam/evaluation, counseling and educating, and documenting clinical information in the EHR.   Return precautions advised.  Garnette Katrinka, MD

## 2023-10-13 ENCOUNTER — Ambulatory Visit: Payer: Self-pay | Admitting: Family Medicine

## 2023-11-01 ENCOUNTER — Encounter: Payer: Self-pay | Admitting: Gastroenterology

## 2023-11-22 ENCOUNTER — Encounter: Payer: Self-pay | Admitting: Family Medicine

## 2023-11-22 ENCOUNTER — Ambulatory Visit (INDEPENDENT_AMBULATORY_CARE_PROVIDER_SITE_OTHER): Admitting: Family Medicine

## 2023-11-22 VITALS — BP 102/62 | HR 92 | Temp 97.3°F | Ht 75.0 in | Wt 221.0 lb

## 2023-11-22 DIAGNOSIS — Z23 Encounter for immunization: Secondary | ICD-10-CM | POA: Diagnosis not present

## 2023-11-22 DIAGNOSIS — Z0000 Encounter for general adult medical examination without abnormal findings: Secondary | ICD-10-CM

## 2023-11-22 NOTE — Patient Instructions (Addendum)
 Glad you are doing well and have gastroenterology appointment upcoming to be on safe side  Recommended follow up: Return in about 1 year (around 11/21/2024) for physical or sooner if needed.Schedule b4 you leave.

## 2023-11-22 NOTE — Progress Notes (Signed)
 Phone: [phone]    Subjective:  Patient presents today for their annual physical. Chief complaint-noted.   See problem oriented charting- ROS- full  review of systems was completed and negative  except for topics noted under acute/chronic concerns  The following were reviewed and entered/updated in epic: Past Medical History:  Diagnosis Date   Asthma    sparing albuterol - mainly with sickness. has not had issues with sports in years.    Migraines    rx through Dr. Gretta. about once a month   Patient Active Problem List   Diagnosis Date Noted   Insomnia 07/05/2017    Priority: Medium    Asthma, mild intermittent     Priority: Medium    Migraines     Priority: Medium    Seasonal allergies 07/05/2017    Priority: Low   Intermittent diarrhea 07/05/2017    Priority: Low   Scapular dyskinesis 11/18/2016    Priority: Low   Closed fracture of nasal bone 10/23/2019   Pain in elbow 08/14/2018   Past Surgical History:  Procedure Laterality Date   CLOSED REDUCTION NASAL FRACTURE N/A 10/26/2019   Procedure: CLOSED REDUCTION NASAL FRACTURE;  Surgeon: Jesus Oliphant, MD;  Location: [facility];  Service: ENT;  Laterality: N/A;   ULNAR COLLATERAL LIGAMENT RECONSTRUCTION     Tommy John surgery   WISDOM TOOTH EXTRACTION      Family History  Problem Relation Age of Onset   Arthritis Mother    Atrial fibrillation Mother    Arthritis Father    Hyperlipidemia Father    GER disease Father    Colon polyps Father        age  45 it appears   Asthma Brother        age 58 in 2019   Arthritis Maternal Grandmother    Diabetes Maternal Grandmother    Dementia Maternal Grandmother    Hearing loss Maternal Grandfather     Medications- reviewed and updated Current Outpatient Medications  Medication Sig Dispense Refill   albuterol  (VENTOLIN  HFA) 108 (90 Base) MCG/ACT inhaler      ondansetron  (ZOFRAN -ODT) 8 MG disintegrating tablet Take 1 tablet (8 mg total) by mouth  every 8 (eight) hours as needed for nausea or vomiting 10 tablet 0   dexamethasone  0.5 MG/5ML elixir Use 5 mL swish and spit three to four times daily if you have a canker sore (Patient not taking: Reported on 11/22/2023) 237 mL 5   levocetirizine (XYZAL) 5 MG tablet Take 5 mg by mouth every evening. (Patient not taking: Reported on 11/22/2023)     No current facility-administered medications for this visit.    Allergies-reviewed and updated Allergies  Allergen Reactions   Bioflavonoids     Other reaction(s): abdominal pain    Social History   Social History Narrative   Single. Dating   Lives with mom, dad, little brother- gap year in 2024.  Dad is patient of Dr. Katrinka      Plans to apply at [employer]- customer experience   Graduate of [employer] summer 2025. 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 Prior [employer]   Started at [employer] baseball.        Hobbies: time with friends, pool in summer      Objective:  BP 102/62 (BP Location: Left Arm, Patient Position: Sitting, Cuff Size: Normal)   Pulse 92   Temp (!) 97.3 F (36.3 C) (Temporal)   Ht 6' 3 (1.905 m)   Wt  221 lb (100.2 kg)   SpO2 97%   BMI 27.62 kg/m  Gen: NAD, resting comfortably HEENT: Mucous membranes are moist. Oropharynx normal Neck: no thyromegaly CV: RRR no murmurs rubs or gallops Lungs: CTAB no crackles, wheeze, rhonchi Abdomen: soft/nontender/nondistended/normal bowel sounds. No rebound or guarding.  Ext: no edema Skin: warm, dry Neuro: grossly normal, moves all extremities, PERRLA    Assessment and Plan:  23 y.o. male presenting for annual physical.  Health Maintenance counseling: 1. Anticipatory guidance: Patient counseled regarding regular dental exams -q6 months, eye exams - yearly eye exams for contacts,  avoiding smoking and second hand smoke , limiting alcohol to 2 beverages per day- once a week up to 12- encouraged to try to limit max 2 a day, no illicit drugs- some  marijuana - encourage cessation.   2. Risk factor reduction:  Advised patient of need for regular exercise and diet rich and fruits and vegetables to reduce risk of heart attack and stroke.  Exercise- 3 x a week in the gym heavy workouts- avoiding squats and heavy on leg press as concerned hernias.  Diet/weight management-weight up 21 pounds from last physical- some muscle mass gain but some fat gain as well  Wt Readings from Last 3 Encounters:  11/22/23 221 lb (100.2 kg)  10/12/23 213 lb 9.6 oz (96.9 kg)  06/21/22 200 lb 6.4 oz (90.9 kg)   3. Immunizations/screenings/ancillary studies-had all childhood vaccinations-flu shot today.  Declines COVID.  Could do Prevnar 20 with asthma history  Immunization History  Administered Date(s) Administered   Hpv-Unspecified 10/14/2011, 01/04/2012, 10/09/2012   Influenza, Seasonal, Injecte, Preservative Fre 11/22/2023   Influenza,inj,Quad PF,6+ Mos 12/28/2017, 12/19/2018   Pneumococcal-Unspecified 06/13/1998, 08/13/1998, 10/14/1998, 07/22/2019   Tdap 07/01/2009, 06/21/2022  4. Prostate cancer screening- no family history, start at age 64   5. Colon cancer screening - upcoming evaluation from GI given rectal bleeding history though likely hemorrhoids 6. Skin cancer screening/prevention-no recent dermatology visit.  advised regular sunscreen use. Denies worrisome, changing, or new skin lesions.  7. Testicular cancer screening- advised monthly self exams  8. STD screening- patient opts out- not having unprotected sex- out of long term relationship from last year but had no concerns about infidelity with her 9. Smoking associated screening-never smoker  Status of chronic or acute concerns   #social update- still looking at employment options- [employer] application pending  # Rectal bleeding-referred to GI at last visit and has upcoming appointment in 7 days-thankfully no significant anemia.  Thought this was likely hemorrhoid related.  We have been more  cautious due to father's history of precancerous polyps at 59.  Thought likely hemorrhoids. No more bleeding since that time is reassuring  # Screening labs along with rectal bleeding included CRP and sedimentation rate with no signs of inflammation to suggest inflammatory bowel disease.  CBC and CMP reassuring. -Screening hyperlipidemia largely reassuring with LDL near 70 at 72   # History of labrum tear-he has wanted to monitor and hold off on orthopedic referral - still working with Birdia Pacini making some progress  #asthma- well over a year since needing  #migraines- no recent issues   Recommended follow up: Return in about 1 year (around 11/21/2024) for physical or sooner if needed.Schedule b4 you leave. Future Appointments  Date Time Provider Department Center  11/29/2023  2:30 PM May, Deanna J, NP [facility]   Lab/Order associations:not fasting   ICD-10-CM   1. Preventative health care  Z00.00     2. Immunization  due  Z23 Flu vaccine trivalent PF, 6mos and older(Flulaval,Afluria,Fluarix,Fluzone)      No orders of the defined types were placed in this encounter.   Return precautions advised.   Garnette Lukes, MD

## 2023-11-29 ENCOUNTER — Encounter: Payer: Self-pay | Admitting: Gastroenterology

## 2023-11-29 ENCOUNTER — Other Ambulatory Visit

## 2023-11-29 ENCOUNTER — Ambulatory Visit: Admitting: Gastroenterology

## 2023-11-29 VITALS — BP 112/70 | HR 77 | Ht 75.0 in | Wt 223.0 lb

## 2023-11-29 DIAGNOSIS — K921 Melena: Secondary | ICD-10-CM | POA: Diagnosis not present

## 2023-11-29 DIAGNOSIS — K1379 Other lesions of oral mucosa: Secondary | ICD-10-CM

## 2023-11-29 DIAGNOSIS — R197 Diarrhea, unspecified: Secondary | ICD-10-CM

## 2023-11-29 NOTE — Progress Notes (Signed)
 Chief Complaint:BRBPR (bright red blood per rectum)  Primary GI Doctor: Dr. San  HPI:  Patient is a  23  year old male patient with past medical history of asthma, who was referred to me by Katrinka Garnette KIDD, MD on 10/12/23 for a evaluation of BRBPR (bright red blood per rectum).  Interval History  Patient has had intermittent gastrointestinal issues for several years. He reports he has never had formal workup. He recently saw his PCP and mentioned recent episode of BRBPR.    He had two episodes back to back beginning of September.  He has not had rectal bleeding since then. He does admit he does a lot of heavy lifting. His PCP recommended he hold off for now.  He has on average 2-5 BM's per day that he reports is more on loose side. No known food triggers. No abdominal pain. No nausea or vomiting.   He notes mouth sores and does use mouth wash. He notes the mouth sores seem to be worse when he is stressed. He had  eczema growing up but has improved.   He has occasional reflux. No dysphagia.  He vapes non nicotine products, reports he is trying to quit. Drinks once a week on weekend.   Surgical history: elbow surgery  Patient's family history includes: mother and maternal grandmother with mouth sores.  Recently finished college and applying for job in [employer].  Wt Readings from Last 3 Encounters:  11/29/23 223 lb (101.2 kg)  11/22/23 221 lb (100.2 kg)  10/12/23 213 lb 9.6 oz (96.9 kg)    Past Medical History:  Diagnosis Date   Asthma    sparing albuterol - mainly with sickness. has not had issues with sports in years.    Migraines    rx through Dr. Gretta. about once a month    Past Surgical History:  Procedure Laterality Date   CLOSED REDUCTION NASAL FRACTURE N/A 10/26/2019   Procedure: CLOSED REDUCTION NASAL FRACTURE;  Surgeon: Jesus Oliphant, MD;  Location: [facility];  Service: ENT;  Laterality: N/A;   ULNAR COLLATERAL LIGAMENT RECONSTRUCTION      Tommy John surgery   WISDOM TOOTH EXTRACTION      Current Outpatient Medications  Medication Sig Dispense Refill   dexamethasone  0.5 MG/5ML elixir Use 5 mL swish and spit three to four times daily if you have a canker sore 237 mL 5   albuterol  (VENTOLIN  HFA) 108 (90 Base) MCG/ACT inhaler  (Patient not taking: Reported on 11/29/2023)     levocetirizine (XYZAL) 5 MG tablet Take 5 mg by mouth every evening. (Patient not taking: Reported on 11/29/2023)     ondansetron  (ZOFRAN -ODT) 8 MG disintegrating tablet Take 1 tablet (8 mg total) by mouth every 8 (eight) hours as needed for nausea or vomiting (Patient not taking: Reported on 11/29/2023) 10 tablet 0   No current facility-administered medications for this visit.    Allergies as of 11/29/2023 - Review Complete 11/29/2023  Allergen Reaction Noted   Bioflavonoids  08/10/2021    Family History  Problem Relation Age of Onset   Arthritis Mother    Atrial fibrillation Mother    Arthritis Father    Hyperlipidemia Father    GER disease Father    Colon polyps Father        age  56 it appears   Asthma Brother        age 50 in 2019   Arthritis Maternal Grandmother    Diabetes Maternal Grandmother  Dementia Maternal Grandmother    Hearing loss Maternal Grandfather     Review of Systems:    Constitutional: No weight loss, fever, chills, weakness or fatigue HEENT: Eyes: No change in vision               Ears, Nose, Throat:  No change in hearing or congestion Skin: No rash or itching Cardiovascular: No chest pain, chest pressure or palpitations   Respiratory: No SOB or cough Gastrointestinal: See HPI and otherwise negative Genitourinary: No dysuria or change in urinary frequency Neurological: No headache, dizziness or syncope Musculoskeletal: No new muscle or joint pain Hematologic: No bleeding or bruising Psychiatric: No history of depression or anxiety    Physical Exam:  Vital signs: BP 112/70   Pulse 77   Ht 6' 3 (1.905  m)   Wt 223 lb (101.2 kg)   BMI 27.87 kg/m   Constitutional:   Pleasant  male appears to be in NAD, Well developed, Well nourished, alert and cooperative Throat: Oral cavity and pharynx without inflammation, swelling or lesion.  Respiratory: Respirations even and unlabored. Lungs clear to auscultation bilaterally.   No wheezes, crackles, or rhonchi.  Cardiovascular: Normal S1, S2. Regular rate and rhythm. No peripheral edema, cyanosis or pallor.  Gastrointestinal:  Soft, nondistended, nontender. No rebound or guarding. Normal bowel sounds. No appreciable masses or hepatomegaly. Rectal:  Not performed. declined Msk:  Symmetrical without gross deformities. Without edema, no deformity or joint abnormality.  Neurologic:  Alert and  oriented x4;  grossly normal neurologically.  Skin:   Dry and intact without significant lesions or rashes.  RELEVANT LABS AND IMAGING: CBC    Latest Ref Rng & Units 10/12/2023   11:58 AM 01/14/2022   12:48 PM 12/19/2018   12:09 PM  CBC  WBC 4.0 - 10.5 K/uL 6.0  12.6  4.3   Hemoglobin 13.0 - 17.0 g/dL 84.5  82.8  84.3   Hematocrit 39.0 - 52.0 % 43.3  46.4  43.5   Platelets 150.0 - 400.0 K/uL 243.0  294  199.0      CMP     Latest Ref Rng & Units 10/12/2023   11:58 AM 01/14/2022    3:31 PM 01/14/2022   12:48 PM  CMP  Glucose 70 - 99 mg/dL 98  76  893   BUN 6 - 23 mg/dL 14  20  20    Creatinine 0.40 - 1.50 mg/dL 8.94  9.02  8.76   Sodium 135 - 145 mEq/L 138  138  137   Potassium 3.5 - 5.1 mEq/L 3.7  4.7  3.8   Chloride 96 - 112 mEq/L 104  100  99   CO2 19 - 32 mEq/L 25  24  17    Calcium 8.4 - 10.5 mg/dL 9.5  9.6  9.7   Total Protein 6.0 - 8.3 g/dL 7.4   8.3   Total Bilirubin 0.2 - 1.2 mg/dL 1.2   1.2   Alkaline Phos 39 - 117 U/L 78   79   AST 0 - 37 U/L 13   38   ALT 0 - 53 U/L 13   28      Lab Results  Component Value Date   TSH 1.20 10/12/2023   10/12/23 Normal CRP and ESR  Assessment: Encounter Diagnoses  Name Primary?   Diarrhea,  unspecified type Yes   Hematochezia    Mouth sores       23 year old male patient who presents with altered bowel habits with  recent episode of bright red blood per rectum.  Declined rectal exam.  We did discuss incorporating more fiber into his diet as well as adding fiber supplementation.  No straining or pushing with bowel movements. Recticare samples provided. TSH 1.20.  CRP and ESR and negative.  No abdominal pain with symptoms.  Will go ahead and check celiac panel.  Does note history of eczema and mouth sores.  No known triggers. If no improvement in symptoms will need to be examined in clinic or proceed with endoscopic procedures. PT agrees to plan.  Plan: -Check TTG IgA, IgA -recommend high fiber diet -start Citrucel 1 tsp po daily -samples of anusol prn -if no improvement will consider endoscopic procedures  -follow-up 2-3 months with myself   Thank you for the courtesy of this consult. Please call me with any questions or concerns.   Ximenna Fonseca, FNP-C [facility] 11/29/2023, 3:26 PM  Cc: Katrinka Garnette KIDD, MD

## 2023-11-29 NOTE — Patient Instructions (Addendum)
 Altered bowel habits  Recommend high fiber diet Start Citrucel 1 tsp po daily  Hemorrhoids Avoiding straining or pushing on toilet Samples of topical for hemorrhoids If symptoms return call office  Your provider has requested that you go to the basement level for lab work before leaving today. Press B on the elevator. The lab is located at the first door on the left as you exit the elevator.  _______________________________________________________  If your blood pressure at your visit was 140/90 or greater, please contact your primary care physician to follow up on this.  _______________________________________________________  If you are age 16 or older, your body mass index should be between 23-30. Your Body mass index is 27.87 kg/m. If this is out of the aforementioned range listed, please consider follow up with your Primary Care Provider.  If you are age 55 or younger, your body mass index should be between 19-25. Your Body mass index is 27.87 kg/m. If this is out of the aformentioned range listed, please consider follow up with your Primary Care Provider.   ________________________________________________________  The [facility] providers would like to encourage you to use MYCHART to communicate with providers for non-urgent requests or questions.  Due to long hold times on the telephone, sending your provider a message by [facility] may be a faster and more efficient way to get a response.  Please allow 48 business hours for a response.  Please remember that this is for non-urgent requests.  _______________________________________________________  [facility] is using a team-based approach to care.  Your team is made up of your doctor and two to three APPS. Our APPS (Nurse Practitioners and Physician Assistants) work with your physician to ensure care continuity for you. They are fully qualified to address your health concerns and develop a treatment plan. They  communicate directly with your gastroenterologist to care for you. Seeing the Advanced Practice Practitioners on your physician's team can help you by facilitating care more promptly, often allowing for earlier appointments, access to diagnostic testing, procedures, and other specialty referrals.   Thank you for trusting me with your gastrointestinal care. Deanna May, FNP-C

## 2023-11-30 LAB — TISSUE TRANSGLUTAMINASE ABS,IGG,IGA
(tTG) Ab, IgA: <1 U/mL
(tTG) Ab, IgG: <1 U/mL

## 2023-11-30 LAB — IGA: Immunoglobulin A: 166 mg/dL (ref 47–310)

## 2023-12-01 ENCOUNTER — Ambulatory Visit: Payer: Self-pay | Admitting: Gastroenterology

## 2024-01-04 ENCOUNTER — Ambulatory Visit: Payer: Self-pay

## 2024-01-04 NOTE — Telephone Encounter (Signed)
 FYI Only or Action Required?: FYI only for provider: appointment scheduled on 01/06/24.  Patient was last seen in primary care on 11/22/2023 by Katrinka Garnette KIDD, MD.  Called Nurse Triage reporting Otalgia.  Symptoms began a week ago.  Interventions attempted: OTC medications: tylenol ; cold &flu.  Symptoms are: unchanged.  Triage Disposition: See PCP Within 2 Weeks  Patient/caregiver understands and will follow disposition?: Yes   Copied from CRM [id number]. Topic: Clinical - Red Word Triage >> Jan 04, 2024  9:51 AM Harlene ORN wrote: Red Word that prompted transfer to Nurse Triage: swollen lymph node NOT inside the ear, around the jaw bone area, in pain (no vertigo). Started a week ago. Still has mobility in his neck. Reason for Disposition  Ear congestion is a chronic symptom (recurrent or ongoing AND present > 4 weeks)  Answer Assessment - Initial Assessment Questions Pt's mom, Alan, called in stating that pt has chronic congestion but now is starting to develop an earache on his R side. He is having continuous nasal drainage down the back of his throat. Pt's mother states he is not running a fever, no cough or difficulty breathing. He is currently taking tylenol  and alka-seltzer sever cold & flu. Pt has chronic allergies but not always compliant with medication. Pt's mother states she has saline and allergy medication but pt won't take it diligently enough to find relief of symptoms. Appointment scheduled for evaluation. Patient agrees with plan of care, and will call back if anything changes, or if symptoms worsen.    1. LOCATION: Which ear is involved?       Right ear   2. SENSATION: Describe how the ear feels. (e.g., stuffy, full, plugged).      Chronic congestion   3. ONSET:  When did the ear symptoms start?       About a week   4. PAIN: Do you also have an earache? If Yes, ask: How bad is it? (Scale 0-10; none, mild, moderate or severe)     Ear feels full and  hurts a bit -pt's mother on phone, unable to get rating from pt   5. CAUSE: What do you think is causing the ear congestion? (e.g., common cold, nasal allergies, recent flight, recent snorkeling)     Chronic congestion and allergies   6. OTHER SYMPTOMS: Do you have any other symptoms? (e.g., ear drainage, hay fever symptoms such as sneezing or a clear nasal discharge; cold symptoms such as a cough or runny nose)     Constant drainage down throat  Protocols used: Ear - Congestion-A-AH

## 2024-01-04 NOTE — Telephone Encounter (Signed)
 Scheduled to see Dr. Katrinka 01/06/2024. Dr. Katrinka will review triage notes.

## 2024-01-04 NOTE — Telephone Encounter (Signed)
 No one on the line when transferred to NT. Attempted to call patient's mother, Alan back. States phone is not in service, unable to leave message.  Copied from CRM #[id number]. Topic: Clinical - Red Word Triage >> Jan 04, 2024  9:37 AM Hadassah PARAS wrote: Red Word that prompted transfer to Nurse Triage: Left ear hurting for a week, swollen gland, congestion. Mother on the line [place]

## 2024-01-04 NOTE — Telephone Encounter (Signed)
 Attempted to contact patient x 2 to discuss symptoms; LVM to return call, Will attempt to contact patient at a later time to further discuss concerns.           Copied from CRM #[id number]. Topic: Clinical - Red Word Triage >> Jan 04, 2024  9:37 AM Hadassah PARAS wrote: Red Word that prompted transfer to Nurse Triage: Left ear hurting for a week, swollen gland, congestion. Mother on the line [place]

## 2024-01-06 ENCOUNTER — Encounter: Payer: Self-pay | Admitting: Family Medicine

## 2024-01-06 ENCOUNTER — Ambulatory Visit: Admitting: Family Medicine

## 2024-01-06 VITALS — BP 106/68 | HR 67 | Temp 98.2°F | Ht 75.0 in | Wt 221.0 lb

## 2024-01-06 DIAGNOSIS — R0981 Nasal congestion: Secondary | ICD-10-CM | POA: Diagnosis not present

## 2024-01-06 DIAGNOSIS — R059 Cough, unspecified: Secondary | ICD-10-CM

## 2024-01-06 DIAGNOSIS — J029 Acute pharyngitis, unspecified: Secondary | ICD-10-CM | POA: Diagnosis not present

## 2024-01-06 LAB — POCT RAPID STREP A (OFFICE): Rapid Strep A Screen: NEGATIVE

## 2024-01-06 LAB — POC COVID19 BINAXNOW: SARS Coronavirus 2 Ag: NEGATIVE

## 2024-01-06 LAB — POCT INFLUENZA A/B
Influenza A, POC: NEGATIVE
Influenza B, POC: NEGATIVE

## 2024-01-06 MED ORDER — AMOXICILLIN-POT CLAVULANATE 875-125 MG PO TABS: 1.0000 | ORAL_TABLET | Freq: Two times a day (BID) | ORAL | 0 refills | Status: AC

## 2024-01-06 NOTE — Patient Instructions (Addendum)
 Significant tonsillar enlargement on exam- consider false negative strep test but also having nasal drainage for last 10 days and could be getting irritated by sinusitis drainage- covering for both with 10 days of Augmentin  antibiotic  If rash develops let me know- would be more likely on amoxicillin  if you had mono  Recommended follow up: Return for as needed for new, worsening, persistent symptoms. -definitely see us  back sooner if worsens

## 2024-01-06 NOTE — Progress Notes (Signed)
 Phone [phone] In person visit   Subjective:   Jacob Hebert is a 23 y.o. year old very pleasant male patient who presents for/with See problem oriented charting Chief Complaint  Patient presents with   Cough   Nasal Congestion    Left Ear pain started a week and half ago. Congestion started a week ago and cough a few days ago. Feels like lymph nodes around the neck are swollen. Throat is a little sore. No fever. When symptoms started he did noticed a mouth sore too.     Past Medical History-  Patient Active Problem List   Diagnosis Date Noted   Insomnia 07/05/2017    Priority: Medium    Asthma, mild intermittent     Priority: Medium    Migraines     Priority: Medium    Seasonal allergies 07/05/2017    Priority: Low   Intermittent diarrhea 07/05/2017    Priority: Low   Scapular dyskinesis 11/18/2016    Priority: Low   Closed fracture of nasal bone 10/23/2019   Pain in elbow 08/14/2018    Medications- reviewed and updated Current Outpatient Medications  Medication Sig Dispense Refill   albuterol  (VENTOLIN  HFA) 108 (90 Base) MCG/ACT inhaler  (Patient taking differently: as needed.)     amoxicillin -clavulanate (AUGMENTIN ) 875-125 MG tablet Take 1 tablet by mouth 2 (two) times daily for 10 days. 20 tablet 0   levocetirizine (XYZAL) 5 MG tablet Take 5 mg by mouth every evening. (Patient taking differently: Take 5 mg by mouth as needed.)     ondansetron  (ZOFRAN -ODT) 8 MG disintegrating tablet Take 1 tablet (8 mg total) by mouth every 8 (eight) hours as needed for nausea or vomiting 10 tablet 0   No current facility-administered medications for this visit.     Objective:  BP 106/68 (BP Location: Left Arm, Patient Position: Sitting, Cuff Size: Normal)   Pulse 67   Temp 98.2 F (36.8 C) (Temporal)   Ht 6' 3 (1.905 m)   Wt 221 lb (100.2 kg)   SpO2 98%   BMI 27.62 kg/m  Gen: NAD, resting comfortably/well-appearing Tympanic membrane is normal on the left after  cerumen removal.  No symptoms in right ear.  Significant tonsillar edema nearly kissing the uvula on the right and within a centimeter on the left.  No sinus tenderness.  Nasal turbinates edematous particular on the right side with some clear and yellow discharge.  Tender cervical lymphadenopathy on the left side in particular CV: RRR no murmurs rubs or gallops Lungs: CTAB no crackles, wheeze, rhonchi Ext: no edema Skin: warm, dry Neuro: grossly normal, moves all extremities  Team completed COVID, flu, strep testing which was negative-they did not enter point care order results today and these will have to be added on Monday-I had to remove the orders to close the chart but I have reached out to team to submit these at a later date     Assessment and Plan   # nasal congestion x 10 days/significant sore throat and cervical lymphadenopathy S:from intake   Left Ear pain started a week and half ago. Congestion started a week ago and cough a few days ago. Feels like lymph nodes around the neck are swollen. Throat is a little sore. No fever. When symptoms started he did noticed a mouth sore too.     Feels things dripping down back of throat with sensitivity there. Also feels the swollen lymph nodes as noted.  - no recent oral sex. Does have  mouth sore as well - 3 weeks out of stopping marijuana -no fever/chills/body aches. No shortness of breath but feels uncomofrotable with cough- some cough persists.  -still working out regularly and even running. Not fatigued.  -no rash on hands or feet- doubt coxsackievirus  A/P: 10 days tonsil enlargement/sore throat/nasal congestion and cervical lymphadenopathy.  Left ear pain likely related to cerumen impaction which was removed and improved symptoms -Negative COVID, flu, strep test. -Could be viral but I am concerned could be bacterial as well and either nongroup a strep OR false negative- we opted to still treat with Augmentin  10 days to cover with  strep but in case of sinusitis with ongoing nasal congestion we treated with Augmentin  instead of plain amoxicillin  -I doubt peritonsillar abscess but tonsils are significantly enlarged right worse than left -Considered mononucleosis but we did not have lab testing available at time of visit-we may test on return visit if persistent issues and also check LFTs which could be elevated in mononucleosis - Significant aphthous ulcer/canker sore on left side of his tongue.  Considered HSV but doubt-denies oral sex - Considered HIV but suspect less likely-if failure to improve we did discuss testing for HIV and HSV in the blood.  He has not had sex since August so this would be an odd timeframe for HIV development and is less likely -Afebrile and well-appearing did not think we needed blood cultures. -Discussed may develop rash if he does have mononucleosis and would stop antibiotics in that case and reach out to us  -Left ear pain resolved with curetting cerumen away -He asks about something like throat cancer and we discussed that would be within the realm of possibility but I thought far less likely and we would certainly start with the above treatment first   Recommended follow up: Return for as needed for new, worsening, persistent symptoms. Future Appointments  Date Time Provider Department Center  11/26/2024  3:00 PM Katrinka Garnette KIDD, MD [facility] Jacob Hebert    Lab/Order associations:   ICD-10-CM   1. Cough, unspecified type  R05.9 POCT rapid strep A    POCT Influenza A/B    POC COVID-19 [facility]    2. Nasal congestion  R09.81 POCT rapid strep A    POCT Influenza A/B    POC COVID-19 [facility]    3. Sore throat  J02.9 POCT rapid strep A    POCT Influenza A/B    POC COVID-19 [facility]      Meds ordered this encounter  Medications   amoxicillin -clavulanate (AUGMENTIN ) 875-125 MG tablet    Sig: Take 1 tablet by mouth 2 (two) times daily for 10 days.    Dispense:  20 tablet    Refill:   0   I personally spent a total of 30 minutes in the care of the patient today including preparing to see the patient, getting/reviewing separately obtained history, performing a medically appropriate exam/evaluation, counseling and educating particularly discussing differential diagnosis and importance of close follow-up as well as precautions for emergency department, and documenting clinical information in the EHR.   Return precautions advised.  Garnette Katrinka, MD

## 2024-01-09 NOTE — Addendum Note (Signed)
 Addended by: CRISTOPHER TAWNI BROCKS on: 01/09/2024 08:12 AM   Modules accepted: Orders

## 2024-01-16 ENCOUNTER — Encounter: Payer: Self-pay | Admitting: Family Medicine

## 2024-01-17 ENCOUNTER — Other Ambulatory Visit: Payer: Self-pay | Admitting: Family Medicine

## 2024-01-17 ENCOUNTER — Encounter (INDEPENDENT_AMBULATORY_CARE_PROVIDER_SITE_OTHER): Payer: Self-pay

## 2024-01-17 DIAGNOSIS — J039 Acute tonsillitis, unspecified: Secondary | ICD-10-CM

## 2024-01-17 NOTE — Telephone Encounter (Signed)
 Referral sent to ent and sent to stat chat. Waiting to hear back from lisa regarding scheduling, she left a vm for the ent office.

## 2024-02-10 ENCOUNTER — Institutional Professional Consult (permissible substitution) (INDEPENDENT_AMBULATORY_CARE_PROVIDER_SITE_OTHER)

## 2024-11-26 ENCOUNTER — Encounter: Admitting: Family Medicine
